# Patient Record
Sex: Male | Born: 1965
Health system: Southern US, Community
[De-identification: ages and names within clinical notes are randomized; demographics above are authoritative.]

## PROBLEM LIST (undated history)

## (undated) DIAGNOSIS — T7840XA Allergy, unspecified, initial encounter: Secondary | ICD-10-CM

## (undated) DIAGNOSIS — I10 Essential (primary) hypertension: Secondary | ICD-10-CM

## (undated) DIAGNOSIS — Z87442 Personal history of urinary calculi: Secondary | ICD-10-CM

## (undated) DIAGNOSIS — R011 Cardiac murmur, unspecified: Secondary | ICD-10-CM

## (undated) HISTORY — DX: Cardiac murmur, unspecified: R01.1

## (undated) HISTORY — PX: LITHOTRIPSY: SUR834

## (undated) HISTORY — DX: Allergy, unspecified, initial encounter: T78.40XA

## (undated) HISTORY — DX: Essential (primary) hypertension: I10

## (undated) HISTORY — PX: OTHER SURGICAL HISTORY: SHX169

---

## 2015-01-17 ENCOUNTER — Emergency Department (HOSPITAL_COMMUNITY)
Admission: EM | Admit: 2015-01-17 | Discharge: 2015-01-17 | Disposition: A | Discharge status: Home / Self Care | Payer: Worker's Compensation | Attending: Emergency Medicine | Admitting: Emergency Medicine

## 2015-01-17 ENCOUNTER — Encounter (HOSPITAL_COMMUNITY): Payer: Self-pay | Admitting: Physical Medicine and Rehabilitation

## 2015-01-17 ENCOUNTER — Emergency Department (HOSPITAL_COMMUNITY): Payer: Worker's Compensation

## 2015-01-17 DIAGNOSIS — Y929 Unspecified place or not applicable: Secondary | ICD-10-CM | POA: Insufficient documentation

## 2015-01-17 DIAGNOSIS — Z23 Encounter for immunization: Secondary | ICD-10-CM | POA: Insufficient documentation

## 2015-01-17 DIAGNOSIS — S67191A Crushing injury of left index finger, initial encounter: Secondary | ICD-10-CM | POA: Insufficient documentation

## 2015-01-17 DIAGNOSIS — S6722XA Crushing injury of left hand, initial encounter: Secondary | ICD-10-CM

## 2015-01-17 DIAGNOSIS — Y9389 Activity, other specified: Secondary | ICD-10-CM | POA: Insufficient documentation

## 2015-01-17 DIAGNOSIS — W231XXA Caught, crushed, jammed, or pinched between stationary objects, initial encounter: Secondary | ICD-10-CM | POA: Diagnosis not present

## 2015-01-17 DIAGNOSIS — Y990 Civilian activity done for income or pay: Secondary | ICD-10-CM | POA: Diagnosis not present

## 2015-01-17 DIAGNOSIS — S6992XA Unspecified injury of left wrist, hand and finger(s), initial encounter: Secondary | ICD-10-CM | POA: Diagnosis present

## 2015-01-17 MED ORDER — CEFAZOLIN SODIUM 1 G IJ SOLR
2.0000 g | Freq: Once | INTRAMUSCULAR | Status: DC
Start: 2015-01-17 — End: 2015-01-17

## 2015-01-17 MED ORDER — TETANUS-DIPHTH-ACELL PERTUSSIS 5-2.5-18.5 LF-MCG/0.5 IM SUSP
0.5000 mL | Freq: Once | INTRAMUSCULAR | Status: AC
  Administered 2015-01-17: 0.5 mL via INTRAMUSCULAR
  Filled 2015-01-17: qty 0.5

## 2015-01-17 MED ORDER — BUPIVACAINE HCL 0.25 % IJ SOLN
30.0000 mL | Freq: Once | INTRAMUSCULAR | Status: AC
  Administered 2015-01-17: 30 mL
  Filled 2015-01-17: qty 30

## 2015-01-17 MED ORDER — LIDOCAINE HCL (PF) 1 % IJ SOLN
30.0000 mL | Freq: Once | INTRAMUSCULAR | Status: DC
  Filled 2015-01-17: qty 30

## 2015-01-17 MED ORDER — FENTANYL CITRATE (PF) 100 MCG/2ML IJ SOLN
50.0000 ug | Freq: Once | INTRAMUSCULAR | Status: AC
  Administered 2015-01-17: 50 ug via INTRAVENOUS
  Filled 2015-01-17: qty 2

## 2015-01-17 MED ORDER — MORPHINE SULFATE 4 MG/ML IJ SOLN
4.0000 mg | Freq: Once | INTRAMUSCULAR | Status: AC
  Administered 2015-01-17: 4 mg via INTRAVENOUS
  Filled 2015-01-17: qty 1

## 2015-01-17 MED ORDER — CEFAZOLIN SODIUM-DEXTROSE 2-3 GM-% IV SOLR
2.0000 g | Freq: Once | INTRAVENOUS | Status: AC
  Administered 2015-01-17: 2 g via INTRAVENOUS
  Filled 2015-01-17: qty 50

## 2015-01-17 MED ORDER — CEPHALEXIN 500 MG PO CAPS: 500.0000 mg | ORAL_CAPSULE | Freq: Four times a day (QID) | ORAL | Status: DC

## 2015-01-17 MED ORDER — OXYCODONE HCL 5 MG PO TABS: 5.0000 mg | ORAL_TABLET | ORAL | Status: DC | PRN

## 2015-01-17 NOTE — Discharge Instructions (Signed)
..  We recommend that you to take vitamin C 1000 mg a day to promote healing. We also recommend that if you require  pain medicine that you take a stool softener to prevent constipation as most pain medicines will have constipation side effects. We recommend either Peri-Colace or Senokot and recommend that you also consider adding MiraLAX to prevent the constipation affects from pain medicine if you are required to use them. These medicines are over the counter and maybe purchased at a local pharmacy. A cup of yogurt and a probiotic can also be helpful during the recovery process as the medicines can disrupt your intestinal environment.  Marland KitchenKeep bandage clean and dry.  Call for any problems.  No smoking.  Criteria for driving a car: you should be off your pain medicine for 7-8 hours, able to drive one handed(confident), thinking clearly and feeling able in your judgement to drive. Continue elevation as it will decrease swelling.  If instructed by MD move your fingers within the confines of the bandage/splint.  Use ice if instructed by your MD. Call immediately for any sudden loss of feeling in your hand/arm or change in functional abilities of the extremity. Please take an aspirin daily, 325 mg.

## 2015-01-17 NOTE — ED Provider Notes (Signed)
CSN: [id number]     Arrival date & time 01/17/15  1023 History   First MD Initiated Contact with Patient 01/17/15 1209     Chief Complaint  Patient presents with  . Finger Injury   Jonathan Mcclain is a 49 y.o. right hand dominant male who presents to the emergency department complaining of an injury to his left second finger. The patient reports he was working with concrete and a metal bar crushed his finger between concrete. He is complaining of 5 out of 10 left index finger pain. Patient is unsure of his last tetanus shot and has been updated in the ED. He denies other injury.   (Consider location/radiation/quality/duration/timing/severity/associated sxs/prior Treatment) HPI  History reviewed. No pertinent past medical history. History reviewed. No pertinent past surgical history. No family history on file. History  Substance Use Topics  . Smoking status: Never Smoker   . Smokeless tobacco: Not on file  . Alcohol Use: No    Review of Systems  Constitutional: Negative for fever.  Respiratory: Negative for cough.   Gastrointestinal: Negative for nausea, vomiting and abdominal pain.  Musculoskeletal:       Left index finger pain and laceration.   Skin: Positive for wound.  Neurological: Negative for dizziness, light-headedness and headaches.      Allergies  Review of patient's allergies indicates no known allergies.  Home Medications   Prior to Admission medications   Medication Sig Start Date End Date Taking? Authorizing Provider  naproxen sodium (ANAPROX) 220 MG tablet Take 220 mg by mouth daily as needed (headaches).   Yes Historical Provider, MD  cephALEXin (KEFLEX) 500 MG capsule Take 1 capsule (500 mg total) by mouth 4 (four) times daily. 01/17/15   Avelina Laine, PA-C  oxyCODONE (ROXICODONE) 5 MG immediate release tablet Take 1 tablet (5 mg total) by mouth every 4 (four) hours as needed for severe pain. 01/17/15   Avelina Laine, PA-C   BP 141/79 mmHg  Pulse 83   Temp(Src) 97.8 F (36.6 C)  Resp 16  Ht 6' (1.829 m)  Wt 190 lb (86.183 kg)  BMI 25.76 kg/m2  SpO2 96% Physical Exam  Constitutional: He appears well-developed and well-nourished. No distress.  HENT:  Head: Normocephalic and atraumatic.  Eyes: Right eye exhibits no discharge. Left eye exhibits no discharge.  Cardiovascular: Normal rate, regular rhythm, normal heart sounds and intact distal pulses.   Bilateral radial pulses are intact.  Pulmonary/Chest: Effort normal. No respiratory distress.  Musculoskeletal:  Good range of motion of his left fingers and wrist.   Neurological: He is alert. Coordination normal.   No numbness or weakness surrounding or proximal to the wound.   Skin: Skin is warm and dry. No rash noted. He is not diaphoretic.  Avulsion and laceration of his distal left index fingertip nailbed and underlying tissue.   Psychiatric: He has a normal mood and affect. His behavior is normal.  Nursing note and vitals reviewed.   ED Course  Procedures (including critical care time) Labs Review Labs Reviewed - No data to display  Imaging Review Dg Finger Index Left  01/17/2015   CLINICAL DATA:  Injured LEFT index finger distally at work working on concrete, laceration, initial encounter  EXAM: LEFT INDEX FINGER 2+V  COMPARISON:  None  FINDINGS: Dressing artifacts distal phalanx.  Displaced tuft fracture distal phalanx LEFT index finger.  Joint spaces preserved.  Osseous mineralization normal.  No additional fracture, dislocation or bone destruction.  Soft tissue deformities due to laceration.  IMPRESSION: Displaced tuft fracture distal phalanx LEFT index finger.   Electronically Signed   By: Lavonia Dana M.D.   On: 01/17/2015 12:36     EKG Interpretation None      Filed Vitals:   01/17/15 1330 01/17/15 1400 01/17/15 1430 01/17/15 1530  BP: 142/83 135/88 127/77 141/79  Pulse: 71 71 64 83  Temp:      Resp: 15  16   Height:      Weight:      SpO2: 97% 96% 95% 96%      MDM   Meds given in ED:  Medications  lidocaine (PF) (XYLOCAINE) 1 % injection 30 mL (30 mLs Infiltration Not Given 01/17/15 1401)  Tdap (BOOSTRIX) injection 0.5 mL (0.5 mLs Intramuscular Given 01/17/15 1155)  fentaNYL (SUBLIMAZE) injection 50 mcg (50 mcg Intravenous Given 01/17/15 1155)  morphine 4 MG/ML injection 4 mg (4 mg Intravenous Given 01/17/15 1259)  bupivacaine (MARCAINE) 0.25 % (with pres) injection 30 mL (30 mLs Infiltration Given 01/17/15 1525)  ceFAZolin (ANCEF) IVPB 2 g/50 mL premix (0 g Intravenous Stopped 01/17/15 1442)    Discharge Medication List as of 01/17/2015  3:37 PM    START taking these medications   Details  cephALEXin (KEFLEX) 500 MG capsule Take 1 capsule (500 mg total) by mouth 4 (four) times daily., Starting 01/17/2015, Until Discontinued, Print    oxyCODONE (ROXICODONE) 5 MG immediate release tablet Take 1 tablet (5 mg total) by mouth every 4 (four) hours as needed for severe pain., Starting 01/17/2015, Until Discontinued, Print        Final diagnoses:  Crushing injury of finger of left hand, initial encounter    This is a 49 y.o. right hand dominant male who presents to the emergency department complaining of an injury to his left second finger. The patient reports he was working with concrete and a metal bar crushed his finger between concrete. On exam patient is afebrile and nontoxic appearing. He has significant derangement of his left index finger. His Tdap was updated in the ED.  X-ray indicates a displaced tuft fracture is distal phalanx. Hand surgery was consulted.  Hand Surgical PA Avelina Laine came down and did a significant repair of his finger in the ED. Patient was given Ancef 2 g in the ED and discharged on Keflex 500 mg 4 times a day. The patient was discharged by Avelina Laine, PA-C with paperwork and follow up in good condition. Please see his note for further.     Waynetta Pean, PA-C 01/17/15 Craig, MD 01/18/15  [phone]

## 2015-01-17 NOTE — ED Notes (Signed)
Pt presents to department for evaluation of L 2nd finger laceration. 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 Bleeding controlled upon arrival to ED. Movement and sensation intact.

## 2015-01-17 NOTE — ED Notes (Signed)
MD at bedside. Hand surgeon

## 2015-01-17 NOTE — Consult Note (Signed)
Reason for Consult: Crush injury to left index finger Referring Physician: Dr. Sheppard [facility]  Jonathan Mcclain is an 49 y.o. male.  HPI: Patient is a pleasant 49 year old gentleman, right-hand dominant, who sustained an on-the-job injury earlier today. He is employed at [employer] and he and a coworker were removing a heavy headstone today. Unfortunately, the patient's left index finger became wedged between a pry bar and a heavy piece of concrete, resulting in a significant crushing injury to the left index finger distal tip. She had immediate pain and deformity and significant bleeding and proceeded to the emergency room for evaluation. Upon evaluation by the emergency room staff he was noted to have findings consistent with significant distal tip disarray, open fracture process about the distal phalanx, and near nail evulsion. Hand surgery consult was implemented. Radiographs are reviewed and show findings consistent with an open fracture about the distal phalanx, comminuted in nature. Patient denies any other acute injuries. He states he did sustain a crush injury to the left middle finger approximately 2 months ago, but did not lose the nail.  History reviewed. No pertinent past medical history.  History reviewed. No pertinent past surgical history.  No family history on file.  Social History:  reports that he has never smoked. He does not have any smokeless tobacco history on file. He reports that he does not drink alcohol or use illicit drugs.  Allergies: No Known Allergies  Medications: I have reviewed the patient's current medications.  No results found for this or any previous visit (from the past 48 hour(s)).  Dg Finger Index Left  01/17/2015   CLINICAL DATA:  Injured LEFT index finger distally at work working on concrete, laceration, initial encounter  EXAM: LEFT INDEX FINGER 2+V  COMPARISON:  None  FINDINGS: Dressing artifacts distal phalanx.   Displaced tuft fracture distal phalanx LEFT index finger.  Joint spaces preserved.  Osseous mineralization normal.  No additional fracture, dislocation or bone destruction.  Soft tissue deformities due to laceration.  IMPRESSION: Displaced tuft fracture distal phalanx LEFT index finger.   Electronically Signed   By: Lavonia Dana M.D.   On: 01/17/2015 12:36    Review of Systems  Constitutional: Negative.   HENT: Negative.   Respiratory: Negative.   Cardiovascular: Negative.   Gastrointestinal: Negative.   Musculoskeletal:       See history of the present illness  Skin: Negative.    Blood pressure 127/77, pulse 64, temperature 97.8 F (36.6 C), resp. rate 16, height 6' (1.829 m), weight 86.183 kg (190 lb), SpO2 95 %. Physical Exam  Patient is pleasant, in no acute distress, alert and oriented, family and friends are in the room Head: Atraumatic normocephalic Chest: Expansions are present Abdomen: Nontender Left upper extremity: Evaluation of the left index finger shows obvious significant soft tissue disarray and deformity, with near amputation and exposed bone present as well as partial nail avulsion. Gross refill is intact.  Assessment/Plan: 1.  left index finger crush injury 2.  open distal phalanx fracture comminuted and displaced about the left index finger distal tip 3.  nailbed laceration  4. Partial nail plate avulsion 5. Near amputation of the left index finger distal phalanx I have had a lengthy discussion with the patient and his family in regards to his upper extremity predicament and our concerns. I have recommended that we proceed ahead urgently with irrigation and excisional debridement as well as repair. I have discussed with him our concerns for infection  given this is an open fracture and secondly, vascular flow, about the radial aspect of the distal phalanx given the significant crush injury and near amputation. Verbal consent was obtained to proceed with reconstruction,  all questions were encouraged and answered. Procedure in detail:  1. Irrigation and excisional debridement of the skin subcutaneous tissue and bony fragments about the left index finger distal tip 2. Treatment of an open fracture about the left index finger distal phalanx 3. Nail plate removal left index finger distal phalanx 4. Nailbed repair left index finger distal phalanx 5. Complex closure left index finger distal tip  After obtaining verbal consent, the patient underwent a local digital block to the left index finger utilizing 5 mL of Xylocaine and 5 mL of Sensorcaine without epinephrine. The patient tolerated this well and there was no complications. Once appropriate anesthetic was obtained attention was turned to the distal tip of the finger. It was noted that he had significant jagged soft tissue disarray about the radial aspect of the finger and exposed bony fragments present. Copious irrigation of greater than 2 L of normal saline was implemented into the wounds. Following this excisional debridement of necrotic and pre-necrotic tissue about the distal tip of the finger was performed to include skin and subcutaneous tissue and bony fragments. Following this, the remaining portion of the nail plate was removed utilizing a Soil scientist without difficulties. Patient had an oblique laceration about the nailbed and the very distal margin all the way to the eponychial fold ulnarly. Laceration continued about the volar radial aspect of the finger. The radial flap about the distal phalanx was gently excised to healthy bleeding tissue. Following this, loose closure of the skin about the radial aspect of the finger was performed utilizing a series of simple chromic sutures, 4 variety. I did make sure not to overly tighten the sutures to insure further blood flow would be compromised. Attention was then turned to the nail bed where the edges were sharply debridement and repair utilizing 5-0 chromic was  performed. Simple sutures were placed about the nailbed. Following this the finger turning it was released and refill was noted. I discussed with the patient our concerns for the possibility and need for further debridements pending the radial flap viability. It was then placed under the eponychial fold followed by nonadherent dressings, followed by soft dressings and a finger splint. We have discussed with the patient the need to elevate the upper extremity, keep this clean and dry. He will take his antibiotics in the form of Keflex 500 mg one 4 times daily and oxycodone 5 mg 1-2 4to6 hours as needed for pain. Discussed with him per his request return to light work duties for Thursday, however, absolutely no use of the left upper extremity and he should keep this clean and dry at all times. Patient was given 2 g of IV Ancef while in the emergency room setting. I have also recommended he take an aspirin 325 mg daily. Have discussed a stool softener while on pain medications and the merits of vitamin C. He will need to follow up in our office in 5-7 days with a therapy appointment immediately following for a distal tip protector. All questions were encouraged and answered.  Sandrea Boer L 01/17/2015, 3:11 PM

## 2015-10-11 ENCOUNTER — Other Ambulatory Visit: Payer: Self-pay | Admitting: Orthopaedic Surgery

## 2015-10-11 DIAGNOSIS — M25511 Pain in right shoulder: Secondary | ICD-10-CM

## 2015-10-18 ENCOUNTER — Ambulatory Visit
Admission: RE | Admit: 2015-10-18 | Discharge: 2015-10-18 | Disposition: A | Discharge status: Home / Self Care | Payer: 59 | Source: Ambulatory Visit | Attending: Orthopaedic Surgery | Admitting: Orthopaedic Surgery

## 2015-10-18 DIAGNOSIS — M25511 Pain in right shoulder: Secondary | ICD-10-CM

## 2018-04-20 ENCOUNTER — Other Ambulatory Visit: Payer: Self-pay | Admitting: Urology

## 2018-04-23 ENCOUNTER — Encounter (HOSPITAL_COMMUNITY): Payer: Self-pay | Admitting: *Deleted

## 2018-04-23 ENCOUNTER — Ambulatory Visit (HOSPITAL_COMMUNITY): Payer: 59

## 2018-04-23 ENCOUNTER — Ambulatory Visit (HOSPITAL_COMMUNITY)
Admission: RE | Admit: 2018-04-23 | Discharge: 2018-04-23 | Disposition: A | Discharge status: Home / Self Care | Payer: 59 | Source: Ambulatory Visit | Attending: Urology | Admitting: Urology

## 2018-04-23 ENCOUNTER — Encounter (HOSPITAL_COMMUNITY)
Admission: RE | Disposition: A | Discharge status: Home / Self Care | Payer: Self-pay | Source: Ambulatory Visit | Attending: Urology

## 2018-04-23 ENCOUNTER — Other Ambulatory Visit: Payer: Self-pay

## 2018-04-23 DIAGNOSIS — N200 Calculus of kidney: Secondary | ICD-10-CM | POA: Diagnosis not present

## 2018-04-23 DIAGNOSIS — N201 Calculus of ureter: Secondary | ICD-10-CM | POA: Diagnosis present

## 2018-04-23 HISTORY — DX: Personal history of urinary calculi: Z87.442

## 2018-04-23 HISTORY — PX: EXTRACORPOREAL SHOCK WAVE LITHOTRIPSY: SHX1557

## 2018-04-23 SURGERY — LITHOTRIPSY, ESWL
Anesthesia: LOCAL | Laterality: Left

## 2018-04-23 MED ORDER — DIAZEPAM 5 MG PO TABS
10.0000 mg | ORAL_TABLET | ORAL | Status: AC
  Administered 2018-04-23: 10 mg via ORAL
  Filled 2018-04-23: qty 2

## 2018-04-23 MED ORDER — TAMSULOSIN HCL 0.4 MG PO CAPS: 0.4000 mg | ORAL_CAPSULE | Freq: Every day | ORAL | 0 refills | Status: AC

## 2018-04-23 MED ORDER — SODIUM CHLORIDE 0.9 % IV SOLN
INTRAVENOUS | Status: DC
  Administered 2018-04-23: 10:00:00 via INTRAVENOUS

## 2018-04-23 MED ORDER — DIPHENHYDRAMINE HCL 25 MG PO CAPS
25.0000 mg | ORAL_CAPSULE | ORAL | Status: AC
  Administered 2018-04-23: 25 mg via ORAL
  Filled 2018-04-23: qty 1

## 2018-04-23 MED ORDER — OXYCODONE-ACETAMINOPHEN 5-325 MG PO TABS: 1.0000 | ORAL_TABLET | Freq: Four times a day (QID) | ORAL | 0 refills | Status: AC | PRN

## 2018-04-23 MED ORDER — CIPROFLOXACIN HCL 500 MG PO TABS
500.0000 mg | ORAL_TABLET | ORAL | Status: AC
  Administered 2018-04-23: 500 mg via ORAL
  Filled 2018-04-23: qty 1

## 2018-04-23 MED ORDER — SENNOSIDES-DOCUSATE SODIUM 8.6-50 MG PO TABS: 1.0000 | ORAL_TABLET | Freq: Two times a day (BID) | ORAL | 0 refills | Status: DC

## 2018-04-23 NOTE — Brief Op Note (Signed)
04/23/2018  1:15 PM  PATIENT:  Jonathan Mcclain  52 y.o. male  PRE-OPERATIVE DIAGNOSIS:  LEFT URETEROPELVIC JUNCTION STONE  POST-OPERATIVE DIAGNOSIS:  * No post-op diagnosis entered *  PROCEDURE:  Procedure(s): LEFT EXTRACORPOREAL SHOCK WAVE LITHOTRIPSY (ESWL) (Left)  SURGEON:  Surgeon(s) and Role:    Alexis Frock, MD - Primary  PHYSICIAN ASSISTANT:   ASSISTANTS: none   ANESTHESIA:   IV sedation  EBL:  minimal   BLOOD ADMINISTERED:none  DRAINS: none   LOCAL MEDICATIONS USED:  NONE  SPECIMEN:  No Specimen  DISPOSITION OF SPECIMEN:  N/A  COUNTS:  YES  TOURNIQUET:  * No tourniquets in log *  DICTATION: .Note written in paper chart  This is likely staged procedure given size and complexity of stone orientation.   PLAN OF CARE: Discharge to home after PACU  PATIENT DISPOSITION:  PACU - hemodynamically stable.   Delay start of Pharmacological VTE agent (>24hrs) due to surgical blood loss or risk of bleeding: yes

## 2018-04-23 NOTE — H&P (Signed)
Jonathan Mcclain is an 52 y.o. male.    Chief Complaint: Pre-op LEFT Shockwave Lithotripsy  HPI:   1 - LEFT Proximal Ureteral Stone - 11 mm left UPJ stone by KUB 04/2018 on eval left flank pain, has had prior stones. Stone appears solitary by KUB.   Today "Jonathan Mcclain" is seen to proceed with LEFT SWL. NO interval fevers.   Social History:  reports that he has never smoked. He does not have any smokeless tobacco history on file. He reports that he does not drink alcohol or use drugs.  Allergies: No Known Allergies  No medications prior to admission.    No results found for this or any previous visit (from the past 48 hour(s)). No results found.  Review of Systems  Constitutional: Negative.  Negative for chills and fever.  HENT: Negative.   Eyes: Negative.   Respiratory: Negative.   Cardiovascular: Negative.   Gastrointestinal: Negative.   Genitourinary: Positive for flank pain.  Skin: Negative.   Neurological: Negative.   Endo/Heme/Allergies: Negative.     There were no vitals taken for this visit. Physical Exam  Constitutional: He appears well-developed.  HENT:  Head: Normocephalic.  Eyes: Pupils are equal, round, and reactive to light.  Neck: Normal range of motion.  Cardiovascular: Normal rate.  Respiratory: Effort normal.  GI: Soft.  Genitourinary:  Genitourinary Comments: Mild LEFT CVAT at present.   Musculoskeletal: Normal range of motion.  Skin: Skin is warm.  Psychiatric: He has a normal mood and affect.     Assessment/Plan  Proceed as planned with LEFT shockwave lithotripsy. Risks, benefits, alternatives, expected peri-op course discussed previously and reiterated today.   Alexis Frock, MD 04/23/2018, 7:22 AM

## 2018-04-23 NOTE — Discharge Instructions (Signed)
1 - You may have urinary urgency (bladder spasms), pass small stone fragments, and bloody urine on / off x 2 weeks. This is normal.  2 - Call MD or go to ER for fever >102, severe pain / nausea / vomiting not relieved by medications, or acute change in medical status

## 2018-06-29 ENCOUNTER — Encounter (HOSPITAL_COMMUNITY): Payer: Self-pay | Admitting: Urology

## 2018-08-18 ENCOUNTER — Ambulatory Visit (INDEPENDENT_AMBULATORY_CARE_PROVIDER_SITE_OTHER): Payer: 59 | Admitting: Orthopaedic Surgery

## 2018-08-18 ENCOUNTER — Encounter (INDEPENDENT_AMBULATORY_CARE_PROVIDER_SITE_OTHER): Payer: Self-pay

## 2018-08-18 ENCOUNTER — Ambulatory Visit (INDEPENDENT_AMBULATORY_CARE_PROVIDER_SITE_OTHER): Payer: Self-pay

## 2018-08-18 ENCOUNTER — Encounter (INDEPENDENT_AMBULATORY_CARE_PROVIDER_SITE_OTHER): Payer: Self-pay | Admitting: Orthopaedic Surgery

## 2018-08-18 VITALS — BP 168/107 | HR 76 | Ht 72.0 in | Wt 180.0 lb

## 2018-08-18 DIAGNOSIS — M79672 Pain in left foot: Secondary | ICD-10-CM

## 2018-08-18 NOTE — Progress Notes (Signed)
Office Visit Note   Patient: Jonathan Mcclain           Date of Birth: Mar 10, 1966           MRN: [id number] Visit Date: 08/18/2018              Requested by: No referring provider defined for this encounter. PCP: Patient, No Pcp Per   Assessment & Plan: Visit Diagnoses:  1. Left foot pain     Plan: Plantar fasciitis left foot with pronation.  Will apply full arch supports consider cortisone injection towards the end of the year when he has several days off.  Can use NSAIDs.  Felt better after insertion of the supports  Follow-Up Instructions: Return if symptoms worsen or fail to improve.   Orders:  Orders Placed This Encounter  Procedures  . XR Foot Complete Left   No orders of the defined types were placed in this encounter.     Procedures: No procedures performed   Clinical Data: No additional findings.   Subjective: Chief Complaint  Patient presents with  . Left Foot - Pain    NKI, a lot of pain in the foot, was in the arch now its in the heel, tried OTC plantar fascitis remedies and did get some relief.  Jonathan Mcclain is 52 years old visited the office evaluation of left foot pain.  He works for a [employer] and is constantly using his foot taken to the ground.  He denies any injury or trauma but is having considerable pain on the plantar aspect of his left foot.  When he wears his work boots he is more comfortable if he walks on a hard surface he has considerable pain.  Pain is on the plantar aspect of his foot between the arch and the os calcis.  No skin changes.  No numbness or tingling.  Denies any ankle pain  HPI  Review of Systems   Objective: Vital Signs: BP (!) 168/107 (BP Location: Left Arm, Patient Position: Sitting)   Pulse 76   Ht 6' (1.829 m)   Wt 180 lb (81.6 kg)   BMI 24.41 kg/m   Physical Exam  Constitutional: He is oriented to person, place, and time. He appears well-developed and well-nourished.  HENT:  Mouth/Throat: Oropharynx is  clear and moist.  Eyes: Pupils are equal, round, and reactive to light. EOM are normal.  Pulmonary/Chest: Effort normal.  Neurological: He is alert and oriented to person, place, and time.  Skin: Skin is warm and dry.  Psychiatric: He has a normal mood and affect. His behavior is normal.    Ortho Exam awake alert and oriented x3 comfortable sitting.  Examination left foot reveals pronation with weightbearing.  He does have a localized area of tenderness just distal to the os calcis in the plantar aspect of his foot.  No skin change.  No posterior heel pain.  No Achilles discomfort.  No pain in the dorsum of the foot.  Neurovascular exam intact  Specialty Comments:  No specialty comments available.  Imaging: Xr Foot Complete Left  Result Date: 08/18/2018 Films of the left foot were obtained in 3 projections standing.  There is a large plantar heel spur which could be consistent with the patient's symptoms of plantar fasciitis.  No arthritic changes in the hindfoot or midfoot.  No acute changes    PMFS History: There are no active problems to display for this patient.  Past Medical History:  Diagnosis Date  .  History of kidney stones     History reviewed. No pertinent family history.  Past Surgical History:  Procedure Laterality Date  . EXTRACORPOREAL SHOCK WAVE LITHOTRIPSY Left 04/23/2018   Procedure: LEFT EXTRACORPOREAL SHOCK WAVE LITHOTRIPSY (ESWL);  Surgeon: Jonathan Frock, MD;  Location: [facility];  Service: Urology;  Laterality: Left;  . LITHOTRIPSY    . tubes in ears bilat     Social History   Occupational History  . Not on file  Tobacco Use  . Smoking status: Never Smoker  . Smokeless tobacco: Never Used  Substance and Sexual Activity  . Alcohol use: No  . Drug use: No  . Sexual activity: Not on file     Jonathan Balding, MD   Note - This record has been created using [employer].  Chart creation errors have been sought, but may not always  have been  located. Such creation errors do not reflect on  the standard of medical care.

## 2018-09-07 ENCOUNTER — Encounter (INDEPENDENT_AMBULATORY_CARE_PROVIDER_SITE_OTHER): Payer: Self-pay | Admitting: Orthopaedic Surgery

## 2018-09-07 ENCOUNTER — Ambulatory Visit (INDEPENDENT_AMBULATORY_CARE_PROVIDER_SITE_OTHER): Payer: 59 | Admitting: Orthopaedic Surgery

## 2018-09-07 VITALS — BP 159/105 | HR 93 | Ht 72.0 in | Wt 185.0 lb

## 2018-09-07 DIAGNOSIS — M79672 Pain in left foot: Secondary | ICD-10-CM | POA: Diagnosis not present

## 2018-09-07 MED ORDER — METHYLPREDNISOLONE ACETATE 40 MG/ML IJ SUSP
40.0000 mg | INTRAMUSCULAR | Status: AC | PRN
  Administered 2018-09-07: 40 mg

## 2018-09-07 MED ORDER — LIDOCAINE HCL 1 % IJ SOLN
1.0000 mL | INTRAMUSCULAR | Status: AC | PRN
  Administered 2018-09-07: 1 mL

## 2018-09-07 MED ORDER — BUPIVACAINE HCL 0.5 % IJ SOLN
1.0000 mL | INTRAMUSCULAR | Status: AC | PRN
  Administered 2018-09-07: 1 mL

## 2018-09-07 NOTE — Progress Notes (Signed)
   Office Visit Note   Patient: Jonathan Mcclain           Date of Birth: Jun 18, 1966           MRN: [id number] Visit Date: 09/07/2018              Requested by: No referring provider defined for this encounter. PCP: Patient, No Pcp Per   Assessment & Plan: Visit Diagnoses:  1. Left foot pain     Plan: Feeling better with the arch supports but still having some pain in a localized area the left plantar fascial attachment.  Will inject with cortisone and monitor response.  Provide with a second arch support  Follow-Up Instructions: Return if symptoms worsen or fail to improve.   Orders:  No orders of the defined types were placed in this encounter.  No orders of the defined types were placed in this encounter.     Procedures: Foot Inj Date/Time: 09/07/2018 4:23 PM Performed by: Garald Balding, MD Authorized by: Garald Balding, MD   Consent Given by:  Patient Indications:  Fasciitis Condition: Plantar Fasciitis   Location: left plantar fascia muscle   Needle Size:  27 G Medications:  1 mL lidocaine 1 %; 1 mL bupivacaine 0.5 %; 40 mg methylPREDNISolone acetate 40 MG/ML     Clinical Data: No additional findings.   Subjective: No chief complaint on file. Still having some discomfort in the area of the plantar fascial attachment to the left os calcis.  Better since using the arch supports  HPI  Review of Systems   Objective: Vital Signs: BP (!) 159/105   Pulse 93   Ht 6' (1.829 m)   Wt 185 lb (83.9 kg)   BMI 25.09 kg/m   Physical Exam  Ortho Exam left foot with some tenderness just distal to the plantar fascial insertion on the medial aspect of the os calcis.  No skin changes.  No masses.  Neurovascular exam intact  Specialty Comments:  No specialty comments available.  Imaging: No results found.   PMFS History: There are no active problems to display for this patient.  Past Medical History:  Diagnosis Date  . History of kidney stones       History reviewed. No pertinent family history.  Past Surgical History:  Procedure Laterality Date  . EXTRACORPOREAL SHOCK WAVE LITHOTRIPSY Left 04/23/2018   Procedure: LEFT EXTRACORPOREAL SHOCK WAVE LITHOTRIPSY (ESWL);  Surgeon: Alexis Frock, MD;  Location: [facility];  Service: Urology;  Laterality: Left;  . LITHOTRIPSY    . tubes in ears bilat     Social History   Occupational History  . Not on file  Tobacco Use  . Smoking status: Never Smoker  . Smokeless tobacco: Never Used  Substance and Sexual Activity  . Alcohol use: No  . Drug use: No  . Sexual activity: Not on file     Garald Balding, MD   Note - This record has been created using [employer].  Chart creation errors have been sought, but may not always  have been located. Such creation errors do not reflect on  the standard of medical care.

## 2018-09-07 NOTE — Progress Notes (Signed)
Lt foot bottom heel still is painful. Denied taking tylenol and wearing the inserts .

## 2019-01-13 ENCOUNTER — Other Ambulatory Visit: Payer: Self-pay

## 2019-01-13 ENCOUNTER — Encounter: Payer: Self-pay | Admitting: Orthopaedic Surgery

## 2019-01-13 ENCOUNTER — Ambulatory Visit (INDEPENDENT_AMBULATORY_CARE_PROVIDER_SITE_OTHER): Payer: BLUE CROSS/BLUE SHIELD | Admitting: Orthopaedic Surgery

## 2019-01-13 VITALS — Ht 72.0 in | Wt 185.0 lb

## 2019-01-13 DIAGNOSIS — M722 Plantar fascial fibromatosis: Secondary | ICD-10-CM | POA: Diagnosis not present

## 2019-01-13 MED ORDER — BUPIVACAINE HCL 0.5 % IJ SOLN
1.0000 mL | INTRAMUSCULAR | Status: AC | PRN
  Administered 2019-01-13: 1 mL

## 2019-01-13 MED ORDER — LIDOCAINE HCL 1 % IJ SOLN
1.0000 mL | INTRAMUSCULAR | Status: AC | PRN
Start: 2019-01-13 — End: 2019-01-13
  Administered 2019-01-13: 1 mL

## 2019-01-13 MED ORDER — METHYLPREDNISOLONE ACETATE 40 MG/ML IJ SUSP
40.0000 mg | INTRAMUSCULAR | Status: AC | PRN
Start: 2019-01-13 — End: 2019-01-13
  Administered 2019-01-13: 40 mg

## 2019-01-13 NOTE — Progress Notes (Signed)
Office Visit Note   Patient: Jonathan Mcclain           Date of Birth: 1965/10/03           MRN: [id number] Visit Date: 01/13/2019              Requested by: No referring provider defined for this encounter. PCP: Patient, No Pcp Per   Assessment & Plan: Visit Diagnoses:  1. Plantar fasciitis of left foot     Plan: Recurrent symptoms of plantar fasciitis left foot.  Long discussion regarding diagnosis and consideration of MRI scan at some point.  Will reinject the area of tenderness just distal to the plantar fascial insertion continue with comfortable shoes and inserts.  Will provide exercises for plantar fasciitis  Follow-Up Instructions: Return if symptoms worsen or fail to improve.   Orders:  Orders Placed This Encounter  Procedures  . Foot Inj   No orders of the defined types were placed in this encounter.     Procedures: Foot Inj Date/Time: 01/13/2019 9:35 AM Performed by: Garald Balding, MD Authorized by: Garald Balding, MD   Indications:  Pain and fasciitis Condition: Plantar Fasciitis   Location: left plantar fascia muscle   Needle Size:  27 G Medications:  1 mL lidocaine 1 %; 1 mL bupivacaine 0.5 %; 40 mg methylPREDNISolone acetate 40 MG/ML     Clinical Data: No additional findings.   Subjective: Chief Complaint  Patient presents with  . Left Foot - Follow-up  Patient presents today for follow up on his left foot. He was here four months ago for plantar fasciitis. He was injected with cortisone in December. He states that it helped for awhile, but symptoms have returned. He does some stretching exercises.  No related back pain hip or thigh pain.  No radiculopathy.  Pain seems to be worse when he first gets up from a sitting position or when he spends long periods of time on his feet.  Work boots and the arch supports really make a difference. HPI  Review of Systems   Objective: Vital Signs: Ht 6' (1.829 m)   Wt 185 lb (83.9 kg)   BMI 25.09  kg/m   Physical Exam Constitutional:      Appearance: He is well-developed.  Eyes:     Pupils: Pupils are equal, round, and reactive to light.  Pulmonary:     Effort: Pulmonary effort is normal.  Skin:    General: Skin is warm and dry.  Neurological:     Mental Status: He is alert and oriented to person, place, and time.  Psychiatric:        Behavior: Behavior normal.     Ortho Exam awake alert and oriented x3.  Comfortable sitting.  Has pain on the plantar aspect of the left foot just distal to the plantar fascial insertion on the os calcis.  No skin changes.  No masses.  Neurologically intact.  Does have pes planus.  Straight leg raise negative.  Painless range of motion both hips and knees  Specialty Comments:  No specialty comments available.  Imaging: No results found.   PMFS History: Patient Active Problem List   Diagnosis Date Noted  . Plantar fasciitis of left foot 01/13/2019   Past Medical History:  Diagnosis Date  . History of kidney stones     History reviewed. No pertinent family history.  Past Surgical History:  Procedure Laterality Date  . EXTRACORPOREAL SHOCK WAVE LITHOTRIPSY Left 04/23/2018   Procedure:  LEFT EXTRACORPOREAL SHOCK WAVE LITHOTRIPSY (ESWL);  Surgeon: Alexis Frock, MD;  Location: [facility];  Service: Urology;  Laterality: Left;  . LITHOTRIPSY    . tubes in ears bilat     Social History   Occupational History  . Not on file  Tobacco Use  . Smoking status: Never Smoker  . Smokeless tobacco: Never Used  Substance and Sexual Activity  . Alcohol use: No  . Drug use: No  . Sexual activity: Not on file

## 2020-05-31 ENCOUNTER — Other Ambulatory Visit: Payer: Self-pay | Admitting: Oncology

## 2020-05-31 ENCOUNTER — Ambulatory Visit (HOSPITAL_COMMUNITY)
Admission: RE | Admit: 2020-05-31 | Discharge: 2020-05-31 | Disposition: A | Discharge status: Home / Self Care | Payer: BC Managed Care – PPO | Source: Ambulatory Visit | Attending: Pulmonary Disease | Admitting: Pulmonary Disease

## 2020-05-31 DIAGNOSIS — U071 COVID-19: Secondary | ICD-10-CM | POA: Insufficient documentation

## 2020-05-31 MED ORDER — SODIUM CHLORIDE 0.9 % IV SOLN: INTRAVENOUS | Status: DC | PRN

## 2020-05-31 MED ORDER — METHYLPREDNISOLONE SODIUM SUCC 125 MG IJ SOLR: 125.0000 mg | Freq: Once | INTRAMUSCULAR | Status: DC | PRN

## 2020-05-31 MED ORDER — EPINEPHRINE 0.3 MG/0.3ML IJ SOAJ: 0.3000 mg | Freq: Once | INTRAMUSCULAR | Status: DC | PRN

## 2020-05-31 MED ORDER — ALBUTEROL SULFATE HFA 108 (90 BASE) MCG/ACT IN AERS: 2.0000 | INHALATION_SPRAY | Freq: Once | RESPIRATORY_TRACT | Status: DC | PRN

## 2020-05-31 MED ORDER — SODIUM CHLORIDE 0.9 % IV SOLN
1200.0000 mg | Freq: Once | INTRAVENOUS | Status: AC
  Administered 2020-05-31: 1200 mg via INTRAVENOUS

## 2020-05-31 MED ORDER — FAMOTIDINE IN NACL 20-0.9 MG/50ML-% IV SOLN: 20.0000 mg | Freq: Once | INTRAVENOUS | Status: DC | PRN

## 2020-05-31 MED ORDER — DIPHENHYDRAMINE HCL 50 MG/ML IJ SOLN: 50.0000 mg | Freq: Once | INTRAMUSCULAR | Status: DC | PRN

## 2020-05-31 NOTE — Progress Notes (Signed)
I connected by phone with  Mr. Demartin now to discuss the potential use of an new treatment for mild to moderate COVID-19 viral infection in non-hospitalized patients.   This patient is a age/sex that meets the FDA criteria for Emergency Use Authorization of casirivimab\imdevimab.  Has a (+) direct SARS-CoV-2 viral test result 1. Has mild or moderate COVID-19  2. Is ? 53 years of age and weighs ? 40 kg 3. Is NOT hospitalized due to COVID-19 4. Is NOT requiring oxygen therapy or requiring an increase in baseline oxygen flow rate due to COVID-19 5. Is within 10 days of symptom onset 6. Has at least one of the high risk factor(s) for progression to severe COVID-19 and/or hospitalization as defined in EUA. ? Specific high risk criteria : BMI > 25   Symptom onset  05/26/2020   I have spoken and communicated the following to the patient or parent/caregiver:   1. FDA has authorized the emergency use of casirivimab\imdevimab for the treatment of mild to moderate COVID-19 in adults and pediatric patients with positive results of direct SARS-CoV-2 viral testing who are 32 years of age and older weighing at least 40 kg, and who are at high risk for progressing to severe COVID-19 and/or hospitalization.   2. The significant known and potential risks and benefits of casirivimab\imdevimab, and the extent to which such potential risks and benefits are unknown.   3. Information on available alternative treatments and the risks and benefits of those alternatives, including clinical trials.   4. Patients treated with casirivimab\imdevimab should continue to self-isolate and use infection control measures (e.g., wear mask, isolate, social distance, avoid sharing personal items, clean and disinfect "high touch" surfaces, and frequent handwashing) according to CDC guidelines.    5. The patient or parent/caregiver has the option to accept or refuse casirivimab\imdevimab .   After reviewing this information with  the patient, The patient agreed to proceed with receiving casirivimab\imdevimab infusion and will be provided a copy of the Fact sheet prior to receiving the infusion.Rulon Abide, AGNP-C [phone] ([place])

## 2020-05-31 NOTE — Progress Notes (Signed)
  Diagnosis: COVID-19  Physician: Wright, MD  Procedure: [facility] In[facility] Med: casirivimab\imdevimab infusion - Provided patient with casirivimab\imdevimab fact sheet for patients, parents and caregivers prior to infusion.  Complications: No immediate complications noted.  Discharge: Discharged home   Duvid Smalls R William Schake 05/31/2020   

## 2020-05-31 NOTE — Discharge Instructions (Signed)

## 2021-06-29 ENCOUNTER — Ambulatory Visit (INDEPENDENT_AMBULATORY_CARE_PROVIDER_SITE_OTHER): Payer: BC Managed Care – PPO | Admitting: Sports Medicine

## 2021-06-29 ENCOUNTER — Other Ambulatory Visit: Payer: Self-pay

## 2021-06-29 ENCOUNTER — Ambulatory Visit (INDEPENDENT_AMBULATORY_CARE_PROVIDER_SITE_OTHER): Payer: BC Managed Care – PPO

## 2021-06-29 ENCOUNTER — Encounter: Payer: Self-pay | Admitting: Sports Medicine

## 2021-06-29 DIAGNOSIS — M25462 Effusion, left knee: Secondary | ICD-10-CM | POA: Diagnosis not present

## 2021-06-29 DIAGNOSIS — M2242 Chondromalacia patellae, left knee: Secondary | ICD-10-CM

## 2021-06-29 DIAGNOSIS — Z09 Encounter for follow-up examination after completed treatment for conditions other than malignant neoplasm: Secondary | ICD-10-CM | POA: Diagnosis not present

## 2021-06-29 DIAGNOSIS — M25461 Effusion, right knee: Secondary | ICD-10-CM | POA: Diagnosis not present

## 2021-06-29 NOTE — Assessment & Plan Note (Addendum)
5 days of severe pain anterior knee, on exam there is positive patellar crepitus with reproduction of pain, I do suspect patellofemoral chondromalacia and/or a patellar or trochlear osteochondral defect. Injected today due to severe pain, adding x-rays, home rehabilitation exercises, reaction knee brace. Return to see me in 4 to 6 weeks, MRI if not sufficiently better.

## 2021-06-29 NOTE — Progress Notes (Signed)
    Procedures performed today:    Procedure: Real-time Ultrasound Guided injection of the left knee Device: Samsung HS60  Verbal informed consent obtained.  Time-out conducted.  Noted no overlying erythema, induration, or other signs of local infection.  Skin prepped in a sterile fashion.  Local anesthesia: Topical Ethyl chloride.  With sterile technique and under real time ultrasound guidance: Noted trace effusion, 1 cc Kenalog 40, 2 cc lidocaine, 2 cc bupivacaine injected easily Completed without difficulty  Advised to call if fevers/chills, erythema, induration, drainage, or persistent bleeding.  Images permanently stored and available for review in PACS.  Impression: Technically successful ultrasound guided injection.  Independent interpretation of notes and tests performed by another provider:   None.  Brief History, Exam, Impression, and Recommendations:    Chondromalacia of patellofemoral joint, left 5 days of severe pain anterior knee, on exam there is positive patellar crepitus with reproduction of pain, I do suspect patellofemoral chondromalacia and/or a patellar or trochlear osteochondral defect. Injected today due to severe pain, adding x-rays, home rehabilitation exercises, reaction knee brace. Return to see me in 4 to 6 weeks, MRI if not sufficiently better.    ___________________________________________ Gwen Her. Dianah Field, M.D., ABFM., CAQSM. Primary Care and [place] Instructor of [place] [facility]

## 2021-07-03 NOTE — Progress Notes (Signed)
VM box is full and cannot accept messages.  Will attempt again.  Charyl Bigger, CMA

## 2021-07-26 ENCOUNTER — Ambulatory Visit (INDEPENDENT_AMBULATORY_CARE_PROVIDER_SITE_OTHER): Payer: BC Managed Care – PPO | Admitting: Sports Medicine

## 2021-07-26 ENCOUNTER — Ambulatory Visit: Payer: BC Managed Care – PPO | Admitting: Emergency Medicine

## 2021-07-26 ENCOUNTER — Other Ambulatory Visit: Payer: Self-pay

## 2021-07-26 DIAGNOSIS — M2242 Chondromalacia patellae, left knee: Secondary | ICD-10-CM | POA: Diagnosis not present

## 2021-07-26 NOTE — Progress Notes (Signed)
    Procedures performed today:    None.  Independent interpretation of notes and tests performed by another provider:   None.  Brief History, Exam, Impression, and Recommendations:    Chondromalacia of patellofemoral joint, left Jonathan Mcclain is a pleasant 55 year old male, at the last visit he was having positive patellar crepitus, pain, we suspected patellofemoral chondromalacia, due to severe pain we injected his knee, he returns today essentially pain-free, he unfortunately still has some discomfort with kneeling, mostly around the house so he favors the other knee, he admits to not doing the conditioning exercises, at this point he can live with the discomfort, but does agree to get more diligent with the conditioning, return as needed.    ___________________________________________ Gwen Her. Dianah Field, M.D., ABFM., CAQSM. Primary Care and [place] Instructor of [place] [facility]

## 2021-07-26 NOTE — Assessment & Plan Note (Signed)
Jonathan Mcclain is a pleasant 55 year old male, at the last visit he was having positive patellar crepitus, pain, we suspected patellofemoral chondromalacia, due to severe pain we injected his knee, he returns today essentially pain-free, he unfortunately still has some discomfort with kneeling, mostly around the house so he favors the other knee, he admits to not doing the conditioning exercises, at this point he can live with the discomfort, but does agree to get more diligent with the conditioning, return as needed.

## 2021-09-19 DIAGNOSIS — R059 Cough, unspecified: Secondary | ICD-10-CM | POA: Diagnosis not present

## 2021-09-19 DIAGNOSIS — J40 Bronchitis, not specified as acute or chronic: Secondary | ICD-10-CM | POA: Diagnosis not present

## 2021-09-19 DIAGNOSIS — J019 Acute sinusitis, unspecified: Secondary | ICD-10-CM | POA: Diagnosis not present

## 2021-09-27 ENCOUNTER — Encounter: Payer: Self-pay | Admitting: Family Medicine

## 2021-09-27 ENCOUNTER — Ambulatory Visit (INDEPENDENT_AMBULATORY_CARE_PROVIDER_SITE_OTHER): Payer: BC Managed Care – PPO | Admitting: Family Medicine

## 2021-09-27 ENCOUNTER — Other Ambulatory Visit: Payer: Self-pay

## 2021-09-27 DIAGNOSIS — I10 Essential (primary) hypertension: Secondary | ICD-10-CM | POA: Diagnosis not present

## 2021-09-27 DIAGNOSIS — K219 Gastro-esophageal reflux disease without esophagitis: Secondary | ICD-10-CM

## 2021-09-27 MED ORDER — LOSARTAN POTASSIUM 50 MG PO TABS: 50.0000 mg | ORAL_TABLET | Freq: Every day | ORAL | 1 refills | Status: DC

## 2021-09-27 NOTE — Progress Notes (Signed)
Jonathan Mcclain - 56 y.o. male MRN [id number]  Date of birth: [birth year]  Subjective No chief complaint on file.   HPI Jonathan Mcclain is a 56 year old male here today for initial visit to establish care.  He has not had a regular primary care doctor in several years.  He does see urology as needed for kidney stones.  He most recently was seen by Dr. Dianah Field for knee pain.  Reports he has had cough for several months.  He is a non-smoker.  He does report daily reflux symptoms.  He is currently managing this with Tums.  He denies chest pain, shortness of breath, palpitations.  Blood pressure is elevated today.  Reports readings at home are typically around 140/100.  He denies any symptoms related to hypertension including chest pain, headaches or vision changes.  ROS:  A comprehensive ROS was completed and negative except as noted per HPI  No Known Allergies  Past Medical History:  Diagnosis Date   History of kidney stones    Hypertension     Past Surgical History:  Procedure Laterality Date   EXTRACORPOREAL SHOCK WAVE LITHOTRIPSY Left 04/23/2018   Procedure: LEFT EXTRACORPOREAL SHOCK WAVE LITHOTRIPSY (ESWL);  Surgeon: Alexis Frock, MD;  Location: [facility];  Service: Urology;  Laterality: Left;   LITHOTRIPSY     tubes in ears bilat      Social History   Socioeconomic History   Marital status: Married    Spouse name: Not on file   Number of children: 6   Years of education: Not on file   Highest education level: Not on file  Occupational History   Not on file  Tobacco Use   Smoking status: Never    Passive exposure: Never   Smokeless tobacco: Never  Vaping Use   Vaping Use: Never used  Substance and Sexual Activity   Alcohol use: Not Currently    Alcohol/week: 1.0 standard drink    Types: 1 Standard drinks or equivalent per week   Drug use: No   Sexual activity: Yes    Partners: Female  Other Topics Concern   Not on file  Social History Narrative   Not on file    Social Determinants of Health   Financial Resource Strain: Not on file  Food Insecurity: Not on file  Transportation Needs: Not on file  Physical Activity: Not on file  Stress: Not on file  Social Connections: Not on file    No family history on file.  Health Maintenance  Topic Date Due   COVID-19 Vaccine (1) Never done   HIV Screening  Never done   Hepatitis C Screening  Never done   COLONOSCOPY (Pts 45-51yrs Insurance coverage will need to be confirmed)  Never done   Zoster Vaccines- Shingrix (1 of 2) Never done   INFLUENZA VACCINE  Never done   TETANUS/TDAP  01/16/2025   HPV VACCINES  Aged Out     ----------------------------------------------------------------------------------------------------------------------------------------------------------------------------------------------------------------- Physical Exam BP (!) 143/103 (BP Location: Left Arm, Patient Position: Sitting, Cuff Size: Normal)    Pulse 84    Temp 97.8 F (36.6 C)    Ht 6' (1.829 m)    Wt 208 lb (94.3 kg)    SpO2 97%    BMI 28.21 kg/m   Physical Exam Constitutional:      Appearance: Normal appearance.  Eyes:     General: No scleral icterus. Cardiovascular:     Rate and Rhythm: Normal rate and regular rhythm.  Pulmonary:     Effort:  Pulmonary effort is normal.     Breath sounds: Normal breath sounds.  Musculoskeletal:     Cervical back: Neck supple.  Neurological:     General: No focal deficit present.     Mental Status: He is alert.  Psychiatric:        Mood and Affect: Mood normal.        Behavior: Behavior normal.    ------------------------------------------------------------------------------------------------------------------------------------------------------------------------------------------------------------------- Assessment and Plan  Essential hypertension Blood pressure elevated in clinic with history of elevated blood pressure at home.  We discussed dietary changes  which may be helpful for managing his blood pressure.  Adding losartan 50 mg daily.    GERD (gastroesophageal reflux disease) I suspect this is contributing to his chronic cough.  Recommend addition of famotidine twice daily.   Meds ordered this encounter  Medications   losartan (COZAAR) 50 MG tablet    Sig: Take 1 tablet (50 mg total) by mouth daily.    Dispense:  90 tablet    Refill:  1    Return in about 4 weeks (around 10/25/2021) for Please schedule for annual exam.    This visit occurred during the SARS-CoV-2 public health emergency.  Safety protocols were in place, including screening questions prior to the visit, additional usage of staff PPE, and extensive cleaning of exam room while observing appropriate contact time as indicated for disinfecting solutions.

## 2021-09-27 NOTE — Patient Instructions (Addendum)
For cough:  Try adding famotidine (pepcid) twice daily for the next few weeks. This is available over the counter.  For blood pressure:  Start losartan 50mg  daily   Schedule an appointment for a physical and labs.  Come fasting for labs.   Managing Your Hypertension Hypertension, also called high blood pressure, is when the force of the blood pressing against the walls of the arteries is too strong. Arteries are blood vessels that carry blood from your heart throughout your body. Hypertension forces the heart to work harder to pump blood and may cause the arteries to become narrow or stiff. Understanding blood pressure readings Your personal target blood pressure may vary depending on your medical conditions, your age, and other factors. A blood pressure reading includes a higher number over a lower number. Ideally, your blood pressure should be below 120/80. You should know that: The first, or top, number is called the systolic pressure. It is a measure of the pressure in your arteries as your heart beats. The second, or bottom number, is called the diastolic pressure. It is a measure of the pressure in your arteries as the heart relaxes. Blood pressure is classified into four stages. Based on your blood pressure reading, your health care provider may use the following stages to determine what type of treatment you need, if any. Systolic pressure and diastolic pressure are measured in a unit called mmHg. Normal Systolic pressure: below 656. Diastolic pressure: below 80. Elevated Systolic pressure: 812-751. Diastolic pressure: below 80. Hypertension stage 1 Systolic pressure: 700-174. Diastolic pressure: 94-49. Hypertension stage 2 Systolic pressure: 675 or above. Diastolic pressure: 90 or above. How can this condition affect me? Managing your hypertension is an important responsibility. Over time, hypertension can damage the arteries and decrease blood flow to important parts of the  body, including the brain, heart, and kidneys. Having untreated or uncontrolled hypertension can lead to: A heart attack. A stroke. A weakened blood vessel (aneurysm). Heart failure. Kidney damage. Eye damage. Metabolic syndrome. Memory and concentration problems. Vascular dementia. What actions can I take to manage this condition? Hypertension can be managed by making lifestyle changes and possibly by taking medicines. Your health care provider will help you make a plan to bring your blood pressure within a normal range. Nutrition  Eat a diet that is high in fiber and potassium, and low in salt (sodium), added sugar, and fat. An example eating plan is called the Dietary Approaches to Stop Hypertension (DASH) diet. To eat this way: Eat plenty of fresh fruits and vegetables. Try to fill one-half of your plate at each meal with fruits and vegetables. Eat whole grains, such as whole-wheat pasta, brown rice, or whole-grain bread. Fill about one-fourth of your plate with whole grains. Eat low-fat dairy products. Avoid fatty cuts of meat, processed or cured meats, and poultry with skin. Fill about one-fourth of your plate with lean proteins such as fish, chicken without skin, beans, eggs, and tofu. Avoid pre-made and processed foods. These tend to be higher in sodium, added sugar, and fat. Reduce your daily sodium intake. Most people with hypertension should eat less than 1,500 mg of sodium a day. Lifestyle  Work with your health care provider to maintain a healthy body weight or to lose weight. Ask what an ideal weight is for you. Get at least 30 minutes of exercise that causes your heart to beat faster (aerobic exercise) most days of the week. Activities may include walking, swimming, or biking. Include exercise to  strengthen your muscles (resistance exercise), such as weight lifting, as part of your weekly exercise routine. Try to do these types of exercises for 30 minutes at least 3 days a  week. Do not use any products that contain nicotine or tobacco, such as cigarettes, e-cigarettes, and chewing tobacco. If you need help quitting, ask your health care provider. Control any long-term (chronic) conditions you have, such as high cholesterol or diabetes. Identify your sources of stress and find ways to manage stress. This may include meditation, deep breathing, or making time for fun activities. Alcohol use Do not drink alcohol if: Your health care provider tells you not to drink. You are pregnant, may be pregnant, or are planning to become pregnant. If you drink alcohol: Limit how much you use to: 0-1 drink a day for women. 0-2 drinks a day for men. Be aware of how much alcohol is in your drink. In the [place] one drink equals one 12 oz bottle of beer (355 mL), one 5 oz glass of wine (148 mL), or one 1 oz glass of hard liquor (44 mL). Medicines Your health care provider may prescribe medicine if lifestyle changes are not enough to get your blood pressure under control and if: Your systolic blood pressure is 130 or higher. Your diastolic blood pressure is 80 or higher. Take medicines only as told by your health care provider. Follow the directions carefully. Blood pressure medicines must be taken as told by your health care provider. The medicine does not work as well when you skip doses. Skipping doses also puts you at risk for problems. Monitoring Before you monitor your blood pressure: Do not smoke, drink caffeinated beverages, or exercise within 30 minutes before taking a measurement. Use the bathroom and empty your bladder (urinate). Sit quietly for at least 5 minutes before taking measurements. Monitor your blood pressure at home as told by your health care provider. To do this: Sit with your back straight and supported. Place your feet flat on the floor. Do not cross your legs. Support your arm on a flat surface, such as a table. Make sure your upper arm is at heart  level. Each time you measure, take two or three readings one minute apart and record the results. You may also need to have your blood pressure checked regularly by your health care provider. General information Talk with your health care provider about your diet, exercise habits, and other lifestyle factors that may be contributing to hypertension. Review all the medicines you take with your health care provider because there may be side effects or interactions. Keep all visits as told by your health care provider. Your health care provider can help you create and adjust your plan for managing your high blood pressure. Where to find more information National Heart, Lung, and Blood Institute: https://wilson-eaton.com/ American Heart Association: www.heart.org Contact a health care provider if: You think you are having a reaction to medicines you have taken. You have repeated (recurrent) headaches. You feel dizzy. You have swelling in your ankles. You have trouble with your vision. Get help right away if: You develop a severe headache or confusion. You have unusual weakness or numbness, or you feel faint. You have severe pain in your chest or abdomen. You vomit repeatedly. You have trouble breathing. These symptoms may represent a serious problem that is an emergency. Do not wait to see if the symptoms will go away. Get medical help right away. Call your local emergency services (911 in the [place]  Do not drive yourself to the hospital. Summary Hypertension is when the force of blood pumping through your arteries is too strong. If this condition is not controlled, it may put you at risk for serious complications. Your personal target blood pressure may vary depending on your medical conditions, your age, and other factors. For most people, a normal blood pressure is less than 120/80. Hypertension is managed by lifestyle changes, medicines, or both. Lifestyle changes to help manage hypertension  include losing weight, eating a healthy, low-sodium diet, exercising more, stopping smoking, and limiting alcohol. This information is not intended to replace advice given to you by your health care provider. Make sure you discuss any questions you have with your health care provider. Document Revised: 09/13/2019 Document Reviewed: 07/27/2019 Elsevier Patient Education  2022 [employer].

## 2021-09-27 NOTE — Assessment & Plan Note (Signed)
Blood pressure elevated in clinic with history of elevated blood pressure at home.  We discussed dietary changes which may be helpful for managing his blood pressure.  Adding losartan 50 mg daily.

## 2021-09-27 NOTE — Assessment & Plan Note (Signed)
I suspect this is contributing to his chronic cough.  Recommend addition of famotidine twice daily.

## 2021-10-19 ENCOUNTER — Telehealth: Payer: Self-pay | Admitting: Family Medicine

## 2021-10-19 DIAGNOSIS — Z125 Encounter for screening for malignant neoplasm of prostate: Secondary | ICD-10-CM

## 2021-10-19 DIAGNOSIS — Z0000 Encounter for general adult medical examination without abnormal findings: Secondary | ICD-10-CM

## 2021-10-19 DIAGNOSIS — Z13220 Encounter for screening for lipoid disorders: Secondary | ICD-10-CM

## 2021-10-19 DIAGNOSIS — I10 Essential (primary) hypertension: Secondary | ICD-10-CM

## 2021-10-19 NOTE — Telephone Encounter (Signed)
Patient would like to come for his fasting labs before his appt on 3/9. Can is orders be placed for when he does come. Patient to did not provide a day that he would be coming

## 2021-10-19 NOTE — Telephone Encounter (Signed)
Task completed. Patient has been updated of fasting labs. Aware of clinic's phlebotomist and [facility] schedule and hours.

## 2021-10-26 ENCOUNTER — Encounter: Payer: BC Managed Care – PPO | Admitting: Family Medicine

## 2021-11-13 DIAGNOSIS — Z13220 Encounter for screening for lipoid disorders: Secondary | ICD-10-CM | POA: Diagnosis not present

## 2021-11-13 DIAGNOSIS — I10 Essential (primary) hypertension: Secondary | ICD-10-CM | POA: Diagnosis not present

## 2021-11-13 DIAGNOSIS — Z125 Encounter for screening for malignant neoplasm of prostate: Secondary | ICD-10-CM | POA: Diagnosis not present

## 2021-11-13 DIAGNOSIS — Z0000 Encounter for general adult medical examination without abnormal findings: Secondary | ICD-10-CM | POA: Diagnosis not present

## 2021-11-14 LAB — COMPLETE METABOLIC PANEL WITH GFR
AG Ratio: 2.1 (calc) (ref 1.0–2.5)
ALT: 13 U/L (ref 9–46)
AST: 19 U/L (ref 10–35)
Albumin: 4.4 g/dL (ref 3.6–5.1)
Alkaline phosphatase (APISO): 63 U/L (ref 35–144)
BUN: 13 mg/dL (ref 7–25)
CO2: 27 mmol/L (ref 20–32)
Calcium: 9.4 mg/dL (ref 8.6–10.3)
Chloride: 105 mmol/L (ref 98–110)
Creat: 1.2 mg/dL (ref 0.70–1.30)
Globulin: 2.1 g/dL (calc) (ref 1.9–3.7)
Glucose, Bld: 103 mg/dL — ABNORMAL HIGH (ref 65–99)
Potassium: 4.5 mmol/L (ref 3.5–5.3)
Sodium: 141 mmol/L (ref 135–146)
Total Bilirubin: 1.2 mg/dL (ref 0.2–1.2)
Total Protein: 6.5 g/dL (ref 6.1–8.1)
eGFR: 72 mL/min/{1.73_m2} (ref >=60)

## 2021-11-14 LAB — CBC WITH DIFFERENTIAL/PLATELET
Absolute Monocytes: 561 cells/uL (ref 200–950)
Basophils Absolute: 47 cells/uL (ref 0–200)
Basophils Relative: 0.6 %
Eosinophils Absolute: 213 cells/uL (ref 15–500)
Eosinophils Relative: 2.7 %
HCT: 49.2 % (ref 38.5–50.0)
Hemoglobin: 17 g/dL (ref 13.2–17.1)
Lymphs Abs: 1533 cells/uL (ref 850–3900)
MCH: 30.3 pg (ref 27.0–33.0)
MCHC: 34.6 g/dL (ref 32.0–36.0)
MCV: 87.7 fL (ref 80.0–100.0)
MPV: 11.8 fL (ref 7.5–12.5)
Monocytes Relative: 7.1 %
Neutro Abs: 5546 cells/uL (ref 1500–7800)
Neutrophils Relative %: 70.2 %
Platelets: 185 10*3/uL (ref 140–400)
RBC: 5.61 10*6/uL (ref 4.20–5.80)
RDW: 12.4 % (ref 11.0–15.0)
Total Lymphocyte: 19.4 %
WBC: 7.9 10*3/uL (ref 3.8–10.8)

## 2021-11-14 LAB — LIPID PANEL W/REFLEX DIRECT LDL
Cholesterol: 184 mg/dL (ref <200)
HDL: 54 mg/dL (ref >=40)
LDL Cholesterol (Calc): 116 mg/dL (calc) — ABNORMAL HIGH
Non-HDL Cholesterol (Calc): 130 mg/dL (calc) — ABNORMAL HIGH (ref <130)
Total CHOL/HDL Ratio: 3.4 (calc) (ref <5.0)
Triglycerides: 57 mg/dL (ref <150)

## 2021-11-14 LAB — PSA: PSA: 1.07 ng/mL (ref <=4.00)

## 2021-11-14 LAB — TSH: TSH: 1.62 mIU/L (ref 0.40–4.50)

## 2021-11-15 ENCOUNTER — Encounter: Payer: Self-pay | Admitting: Family Medicine

## 2021-11-15 ENCOUNTER — Ambulatory Visit (INDEPENDENT_AMBULATORY_CARE_PROVIDER_SITE_OTHER): Payer: BC Managed Care – PPO | Admitting: Family Medicine

## 2021-11-15 ENCOUNTER — Other Ambulatory Visit: Payer: Self-pay

## 2021-11-15 VITALS — BP 137/94 | HR 97 | Temp 98.1°F | Ht 72.0 in | Wt 206.0 lb

## 2021-11-15 DIAGNOSIS — Z0000 Encounter for general adult medical examination without abnormal findings: Secondary | ICD-10-CM

## 2021-11-15 DIAGNOSIS — Z1211 Encounter for screening for malignant neoplasm of colon: Secondary | ICD-10-CM | POA: Diagnosis not present

## 2021-11-15 NOTE — Assessment & Plan Note (Signed)
Well adult ?Recent labs reviewed with him today. ?Immunizations: Declines Shingrix and flu vaccine ?Screenings: Referral placed for colonoscopy ?Anticipatory guidance/risk factor reduction: Recommendations per AVS. ?

## 2021-11-15 NOTE — Patient Instructions (Signed)

## 2021-11-15 NOTE — Progress Notes (Signed)
?Jonathan Mcclain - 56 y.o. male MRN [id number]  Date of birth: Feb 23, 1966 ? ?Subjective ?Chief Complaint  ?Patient presents with  ? Annual Exam  ? ? ?HPI ?Jonathan Mcclain is a 56 year old male here today for annual exam.  Reports he is doing well at this time.  He had labs completed prior to visit today which we reviewed.  This did show mild elevation and LDL as well as blood glucose but nothing significant. ? ?His job does keep him fairly active.  He feels like his diet is pretty healthy. ? ?He is a non-smoker.  He rarely consumes alcohol. ? ?He is due for colon cancer screening and would like referral for colonoscopy. ? ?He does not want to have flu or shingles vaccine at this time. ? ?ROS:  A comprehensive ROS was completed and negative except as noted per HPI ? ? ?No Known Allergies ? ?Past Medical History:  ?Diagnosis Date  ? History of kidney stones   ? Hypertension   ? ? ?Past Surgical History:  ?Procedure Laterality Date  ? EXTRACORPOREAL SHOCK WAVE LITHOTRIPSY Left 04/23/2018  ? Procedure: LEFT EXTRACORPOREAL SHOCK WAVE LITHOTRIPSY (ESWL);  Surgeon: Alexis Frock, MD;  Location: [facility];  Service: Urology;  Laterality: Left;  ? LITHOTRIPSY    ? tubes in ears bilat    ? ? ?Social History  ? ?Socioeconomic History  ? Marital status: Married  ?  Spouse name: Not on file  ? Number of children: 6  ? Years of education: Not on file  ? Highest education level: Not on file  ?Occupational History  ? Not on file  ?Tobacco Use  ? Smoking status: Never  ?  Passive exposure: Never  ? Smokeless tobacco: Never  ?Vaping Use  ? Vaping Use: Never used  ?Substance and Sexual Activity  ? Alcohol use: Not Currently  ?  Alcohol/week: 1.0 standard drink  ?  Types: 1 Standard drinks or equivalent per week  ? Drug use: No  ? Sexual activity: Yes  ?  Partners: Female  ?Other Topics Concern  ? Not on file  ?Social History Narrative  ? Not on file  ? ?Social Determinants of Health  ? ?Financial Resource Strain: Not on file  ?Food Insecurity: Not  on file  ?Transportation Needs: Not on file  ?Physical Activity: Not on file  ?Stress: Not on file  ?Social Connections: Not on file  ? ? ?History reviewed. No pertinent family history. ? ?Health Maintenance  ?Topic Date Due  ? COVID-19 Vaccine (1) Never done  ? HIV Screening  Never done  ? Hepatitis C Screening  Never done  ? COLONOSCOPY (Pts 45-46yr Insurance coverage will need to be confirmed)  Never done  ? Zoster Vaccines- Shingrix (1 of 2) Never done  ? INFLUENZA VACCINE  Never done  ? TETANUS/TDAP  01/16/2025  ? HPV VACCINES  Aged Out  ? ? ? ?----------------------------------------------------------------------------------------------------------------------------------------------------------------------------------------------------------------- ?Physical Exam ?BP (!) 137/94 (BP Location: Left Arm, Patient Position: Sitting, Cuff Size: Large)   Pulse 97   Temp 98.1 ?F (36.7 ?C)   Ht 6' (1.829 m)   Wt 206 lb (93.4 kg)   SpO2 97%   BMI 27.94 kg/m?  ? ?Physical Exam ?Constitutional:   ?   Appearance: Normal appearance.  ?Eyes:  ?   General: No scleral icterus. ?Cardiovascular:  ?   Rate and Rhythm: Normal rate and regular rhythm.  ?Pulmonary:  ?   Effort: Pulmonary effort is normal.  ?   Breath sounds: Normal breath  sounds.  ?Musculoskeletal:  ?   Cervical back: Neck supple.  ?Neurological:  ?   General: No focal deficit present.  ?   Mental Status: He is alert.  ?Psychiatric:     ?   Mood and Affect: Mood normal.     ?   Behavior: Behavior normal.  ? ? ?------------------------------------------------------------------------------------------------------------------------------------------------------------------------------------------------------------------- ?Assessment and Plan ? ?Well adult exam ?Well adult ?Recent labs reviewed with him today. ?Immunizations: Declines Shingrix and flu vaccine ?Screenings: Referral placed for colonoscopy ?Anticipatory guidance/risk factor reduction:  Recommendations per AVS. ? ? ?No orders of the defined types were placed in this encounter. ? ? ?Return in about 6 months (around 05/18/2022) for HTN. ? ? ? ?This visit occurred during the SARS-CoV-2 public health emergency.  Safety protocols were in place, including screening questions prior to the visit, additional usage of staff PPE, and extensive cleaning of exam room while observing appropriate contact time as indicated for disinfecting solutions.  ? ?

## 2022-01-03 ENCOUNTER — Ambulatory Visit (AMBULATORY_SURGERY_CENTER): Payer: BC Managed Care – PPO | Admitting: *Deleted

## 2022-01-03 VITALS — Ht 72.0 in | Wt 200.0 lb

## 2022-01-03 DIAGNOSIS — Z1211 Encounter for screening for malignant neoplasm of colon: Secondary | ICD-10-CM

## 2022-01-03 MED ORDER — NA SULFATE-K SULFATE-MG SULF 17.5-3.13-1.6 GM/177ML PO SOLN: 1.0000 | Freq: Once | ORAL | 0 refills | Status: AC

## 2022-01-03 NOTE — Progress Notes (Signed)
No egg or soy allergy known to patient  ?No issues known to pt with past sedation with any surgeries or procedures ?Patient denies ever being told they had issues or difficulty with intubation  ?No FH of Malignant Hyperthermia ?Pt is not on diet pills ?Pt is not on  home 02  ?Pt is not on blood thinners  ?Pt denies issues with constipation  ?No A fib or A flutter ? ?Suprep Coupon to pt in PV today , Code to Pharmacy and  NO PA's for preps discussed with pt In PV today  ?Discussed with pt there will be an out-of-pocket cost for prep and that varies from $0 to 70 +  dollars - pt verbalized understanding  ?Pt instructed to use Singlecare.com or GoodRx for a price reduction on prep  ? ?PV completed over the phone. Pt verified name, DOB, address and insurance during PV today.  ?Pt mailed instruction packet with copy of consent form to read and not return, and instructions.  ?Pt encouraged to call with questions or issues.  ?If pt has My chart, procedure instructions sent via My Chart  ?Insurance confirmed with pt at [facility] today   ?

## 2022-01-09 ENCOUNTER — Encounter: Payer: Self-pay | Admitting: Gastroenterology

## 2022-01-17 ENCOUNTER — Encounter: Payer: Self-pay | Admitting: Gastroenterology

## 2022-01-17 ENCOUNTER — Ambulatory Visit (AMBULATORY_SURGERY_CENTER): Payer: BC Managed Care – PPO | Admitting: Gastroenterology

## 2022-01-17 VITALS — BP 116/82 | HR 65 | Temp 97.5°F | Resp 21 | Ht 72.0 in | Wt 206.0 lb

## 2022-01-17 DIAGNOSIS — K635 Polyp of colon: Secondary | ICD-10-CM | POA: Diagnosis not present

## 2022-01-17 DIAGNOSIS — D122 Benign neoplasm of ascending colon: Secondary | ICD-10-CM | POA: Diagnosis not present

## 2022-01-17 DIAGNOSIS — Z1211 Encounter for screening for malignant neoplasm of colon: Secondary | ICD-10-CM | POA: Diagnosis not present

## 2022-01-17 DIAGNOSIS — D125 Benign neoplasm of sigmoid colon: Secondary | ICD-10-CM

## 2022-01-17 DIAGNOSIS — D123 Benign neoplasm of transverse colon: Secondary | ICD-10-CM

## 2022-01-17 MED ORDER — SODIUM CHLORIDE 0.9 % IV SOLN: 500.0000 mL | Freq: Once | INTRAVENOUS | Status: DC

## 2022-01-17 NOTE — Progress Notes (Signed)
Called to room to assist during endoscopic procedure.  Patient ID and intended procedure confirmed with present staff. Received instructions for my participation in the procedure from the performing physician.  ? ? ?Dr. Lyndel Safe was made aware we did not retrieve Ascending polyp.  maw ?

## 2022-01-17 NOTE — Op Note (Signed)
[facility] ?Patient Name: Jonathan Mcclain ?Procedure Date: 01/17/2022 10:34 AM ?MRN: [id number] ?Endoscopist: Jackquline Denmark , MD ?Age: 56 ?Referring MD:  ?Date of Birth: [birth year] ?Gender: Male ?Account #: [id number] ?Procedure:                Colonoscopy ?Indications:              Screening for colorectal malignant neoplasm ?Medicines:                Monitored Anesthesia Care ?Procedure:                Pre-Anesthesia Assessment: ?                          - Prior to the procedure, a History and Physical  ?                          was performed, and patient medications and  ?                          allergies were reviewed. The patient's tolerance of  ?                          previous anesthesia was also reviewed. The risks  ?                          and benefits of the procedure and the sedation  ?                          options and risks were discussed with the patient.  ?                          All questions were answered, and informed consent  ?                          was obtained. Prior Anticoagulants: The patient has  ?                          taken no previous anticoagulant or antiplatelet  ?                          agents. ASA Grade Assessment: II - A patient with  ?                          mild systemic disease. After reviewing the risks  ?                          and benefits, the patient was deemed in  ?                          satisfactory condition to undergo the procedure. ?                          After obtaining informed consent, the colonoscope  ?  was passed under direct vision. Throughout the  ?                          procedure, the patient's blood pressure, pulse, and  ?                          oxygen saturations were monitored continuously. The  ?                          Olympus CF-[id number] (#[id number]) Colonoscope was  ?                          introduced through the anus and advanced to the 2  ?                          cm into the ileum. The  colonoscopy was performed  ?                          without difficulty. The patient tolerated the  ?                          procedure well. The quality of the bowel  ?                          preparation was good. The terminal ileum, ileocecal  ?                          valve, appendiceal orifice, and rectum were  ?                          photographed. ?Scope In: 10:50:46 AM ?Scope Out: 11:05:11 AM ?Scope Withdrawal Time: 0 hours 12 minutes 23 seconds  ?Total Procedure Duration: 0 hours 14 minutes 25 seconds  ?Findings:                 Two sessile polyps were found in the proximal  ?                          transverse colon and mid ascending colon. The  ?                          polyps were 12m and 4 mm in size. These polyps were  ?                          removed with a cold snare. Resection was complete.  ?                          The smaller 1 mm polyp was not retrieved. ?                          A 6 mm polyp was found in the mid sigmoid colon.  ?                          The  polyp was sessile. The polyp was removed with a  ?                          cold snare. Resection and retrieval were complete. ?                          Non-bleeding internal hemorrhoids were found during  ?                          retroflexion. The hemorrhoids were small and Grade  ?                          I (internal hemorrhoids that do not prolapse). ?                          The terminal ileum appeared normal. ?                          The exam was otherwise without abnormality on  ?                          direct and retroflexion views. ?Complications:            No immediate complications. ?Estimated Blood Loss:     Estimated blood loss: none. ?Impression:               - Two 1 to 4 mm polyps in the proximal transverse  ?                          colon and in the mid ascending colon, removed with  ?                          a cold snare. ?                          - One 6 mm polyp in the mid sigmoid colon, removed   ?                          with a cold snare. ?                          - Non-bleeding internal hemorrhoids. ?                          - The examined portion of the ileum was normal. ?                          - The examination was otherwise normal on direct  ?                          and retroflexion views. ?Recommendation:           - Patient has a contact number available for  ?  emergencies. The signs and symptoms of potential  ?                          delayed complications were discussed with the  ?                          patient. Return to normal activities tomorrow.  ?                          Written discharge instructions were provided to the  ?                          patient. ?                          - Resume previous diet. ?                          - Continue present medications. ?                          - Await pathology results. ?                          - Repeat colonoscopy for surveillance based on  ?                          pathology results. ?                          - The findings and recommendations were discussed  ?                          with the patient's family. ?Jackquline Denmark, MD ?01/17/2022 11:09:22 AM ?This report has been signed electronically. ?

## 2022-01-17 NOTE — Patient Instructions (Signed)
Handout given for polyps.  YOU HAD AN ENDOSCOPIC PROCEDURE TODAY AT THE [facility]:   Refer to the procedure report that was given to you for any specific questions about what was found during the examination.  If the procedure report does not answer your questions, please call your gastroenterologist to clarify.  If you requested that your care partner not be given the details of your procedure findings, then the procedure report has been included in a sealed envelope for you to review at your convenience later.  YOU SHOULD EXPECT: Some feelings of bloating in the abdomen. Passage of more gas than usual.  Walking can help get rid of the air that was put into your GI tract during the procedure and reduce the bloating. If you had a lower endoscopy (such as a colonoscopy or flexible sigmoidoscopy) you may notice spotting of blood in your stool or on the toilet paper. If you underwent a bowel prep for your procedure, you may not have a normal bowel movement for a few days.  Please Note:  You might notice some irritation and congestion in your nose or some drainage.  This is from the oxygen used during your procedure.  There is no need for concern and it should clear up in a day or so.  SYMPTOMS TO REPORT IMMEDIATELY:   Following lower endoscopy (colonoscopy or flexible sigmoidoscopy):  Excessive amounts of blood in the stool  Significant tenderness or worsening of abdominal pains  Swelling of the abdomen that is new, acute  Fever of 100F or higher  For urgent or emergent issues, a gastroenterologist can be reached at any hour by calling [phone]. Do not use MyChart messaging for urgent concerns.    DIET:  We do recommend a small meal at first, but then you may proceed to your regular diet.  Drink plenty of fluids but you should avoid alcoholic beverages for 24 hours.  ACTIVITY:  You should plan to take it easy for the rest of today and you should NOT DRIVE or use heavy  machinery until tomorrow (because of the sedation medicines used during the test).    FOLLOW UP: Our staff will call the number listed on your records 48-72 hours following your procedure to check on you and address any questions or concerns that you may have regarding the information given to you following your procedure. If we do not reach you, we will leave a message.  We will attempt to reach you two times.  During this call, we will ask if you have developed any symptoms of COVID 19. If you develop any symptoms (ie: fever, flu-like symptoms, shortness of breath, cough etc.) before then, please call [phone].  If you test positive for Covid 19 in the 2 weeks post procedure, please call and report this information to us.    If any biopsies were taken you will be contacted by phone or by letter within the next 1-3 weeks.  Please call us at [phone] if you have not heard about the biopsies in 3 weeks.    SIGNATURES/CONFIDENTIALITY: You and/or your care partner have signed paperwork which will be entered into your electronic medical record.  These signatures attest to the fact that that the information above on your After Visit Summary has been reviewed and is understood.  Full responsibility of the confidentiality of this discharge information lies with you and/or your care-partner. 

## 2022-01-17 NOTE — Progress Notes (Signed)
[facility] History and Physical ? ? ?Primary Care Physician:  Luetta Nutting, DO ? ? ?Reason for Procedure:  CRC screening ? ?Plan:    colon ? ? ? ? ?HPI: Jonathan Mcclain is a 56 y.o. male  ?No nausea, vomiting, heartburn, regurgitation, odynophagia or dysphagia.  No significant diarrhea or constipation.  No melena or hematochezia. No unintentional weight loss. No abdominal pain. ? ? ?Past Medical History:  ?Diagnosis Date  ? Allergy   ? SEASONAL  ? Heart murmur   ? "EARLY TEENS"  ? History of kidney stones   ? Hypertension   ? ? ?Past Surgical History:  ?Procedure Laterality Date  ? EXTRACORPOREAL SHOCK WAVE LITHOTRIPSY Left 04/23/2018  ? Procedure: LEFT EXTRACORPOREAL SHOCK WAVE LITHOTRIPSY (ESWL);  Surgeon: Alexis Frock, MD;  Location: [facility];  Service: Urology;  Laterality: Left;  ? LITHOTRIPSY    ? tubes in ears bilat    ? AGE 29  ? ? ?Prior to Admission medications   ?Medication Sig Start Date End Date Taking? Authorizing Provider  ?losartan (COZAAR) 50 MG tablet Take 1 tablet (50 mg total) by mouth daily. 09/27/21  Yes Luetta Nutting, DO  ? ? ?Current Outpatient Medications  ?Medication Sig Dispense Refill  ? losartan (COZAAR) 50 MG tablet Take 1 tablet (50 mg total) by mouth daily. 90 tablet 1  ? ?Current Facility-Administered Medications  ?Medication Dose Route Frequency Provider Last Rate Last Admin  ? 0.9 %  sodium chloride infusion  500 mL Intravenous Once Jackquline Denmark, MD      ? ? ?Allergies as of 01/17/2022  ? (No Known Allergies)  ? ? ?Family History  ?Problem Relation Age of Onset  ? Colon cancer Neg Hx   ? Colon polyps Neg Hx   ? Crohn's disease Neg Hx   ? Esophageal cancer Neg Hx   ? Rectal cancer Neg Hx   ? Stomach cancer Neg Hx   ? ? ?Social History  ? ?Socioeconomic History  ? Marital status: Married  ?  Spouse name: Not on file  ? Number of children: 6  ? Years of education: Not on file  ? Highest education level: Not on file  ?Occupational History  ? Not on file  ?Tobacco Use  ?  Smoking status: Never  ?  Passive exposure: Never  ? Smokeless tobacco: Never  ?Vaping Use  ? Vaping Use: Never used  ?Substance and Sexual Activity  ? Alcohol use: Not Currently  ?  Alcohol/week: 1.0 standard drink  ?  Types: 1 Standard drinks or equivalent per week  ? Drug use: No  ? Sexual activity: Yes  ?  Partners: Female  ?Other Topics Concern  ? Not on file  ?Social History Narrative  ? Not on file  ? ?Social Determinants of Health  ? ?Financial Resource Strain: Not on file  ?Food Insecurity: Not on file  ?Transportation Needs: Not on file  ?Physical Activity: Not on file  ?Stress: Not on file  ?Social Connections: Not on file  ?Intimate Partner Violence: Not on file  ? ? ?Review of Systems: ?Positive for none ?All other review of systems negative except as mentioned in the HPI. ? ?Physical Exam: ?Vital signs in last 24 hours: ?'@VSRANGES'$ @ ?  ?General:   Alert,  Well-developed, well-nourished, pleasant and cooperative in NAD ?Lungs:  Clear throughout to auscultation.   ?Heart:  Regular rate and rhythm; no murmurs, clicks, rubs,  or gallops. ?Abdomen:  Soft, nontender and nondistended. Normal bowel sounds.   ?  Neuro/Psych:  Alert and cooperative. Normal mood and affect. A and O x 3 ? ? ? ?No significant changes were identified.  The patient continues to be an appropriate candidate for the planned procedure and anesthesia. ? ? ?Carmell Austria, MD. ?[facility] ?01/17/2022 10:48 AM@ ? ?

## 2022-01-17 NOTE — Progress Notes (Signed)
VSS, transported to PACU °

## 2022-01-21 ENCOUNTER — Telehealth: Payer: Self-pay

## 2022-01-21 NOTE — Telephone Encounter (Signed)
?  Follow up Call- ? ? ?  01/17/2022  ? 10:32 AM  ?Call back number  ?Post procedure Call Back phone  # [phone]  ?Permission to leave phone message Yes  ?  ? ?Patient questions: ? ?Do you have a fever, pain , or abdominal swelling? No. ?Pain Score  0 * ? ?Have you tolerated food without any problems? Yes.   ? ?Have you been able to return to your normal activities? Yes.   ? ?Do you have any questions about your discharge instructions: ?Diet   No. ?Medications  No. ?Follow up visit  No. ? ?Do you have questions or concerns about your Care? No. ? ?Actions: ?* If pain score is 4 or above: ?No action needed, pain <4. ? ? ? ? ?

## 2022-01-27 ENCOUNTER — Encounter: Payer: Self-pay | Admitting: Gastroenterology

## 2022-03-08 IMAGING — DX DG KNEE COMPLETE 4+V*L*
4 series · 4 of 4 positions shown · non-contrast
Comparison: None.

CLINICAL DATA: Chondromalacia of patellofemoral joint, left. Left
knee pain.

EXAM:
RIGHT KNEE - 1-2 VIEW; LEFT KNEE - COMPLETE 4+ VIEW

[knee tunnel]
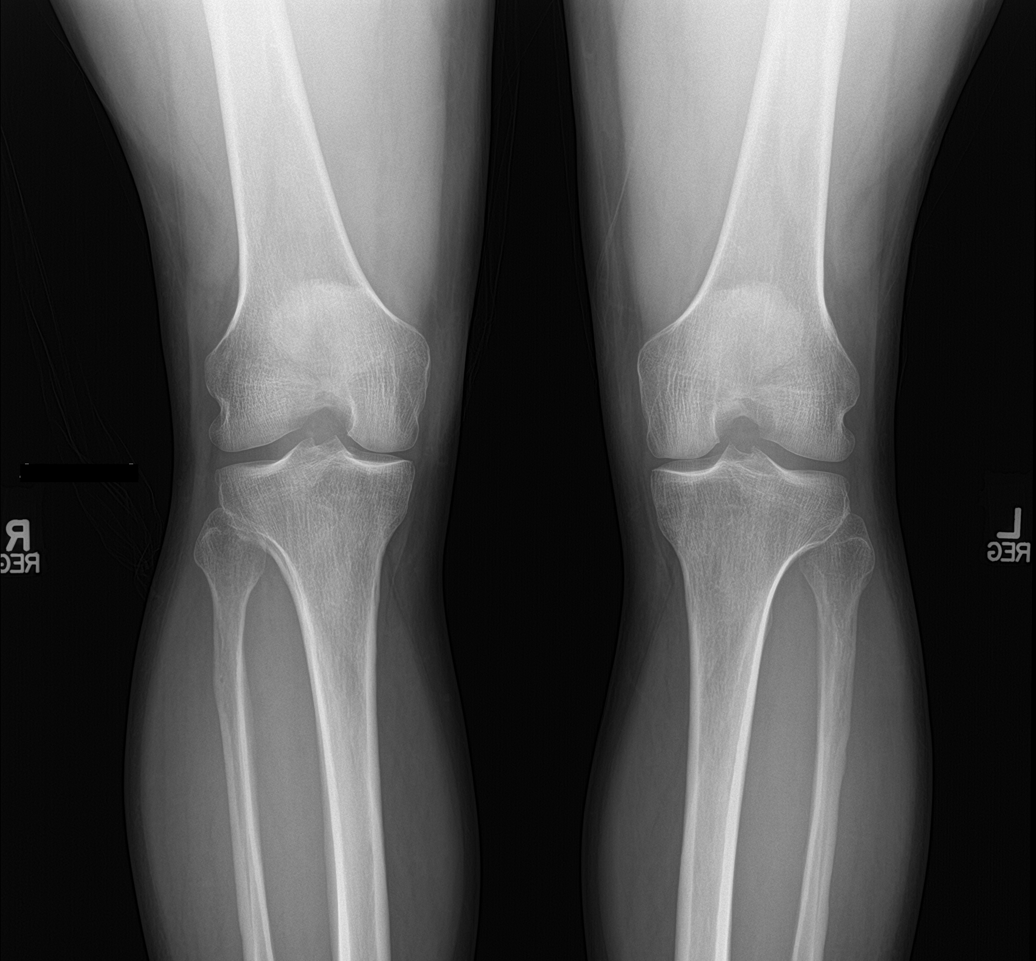

[knee lat]
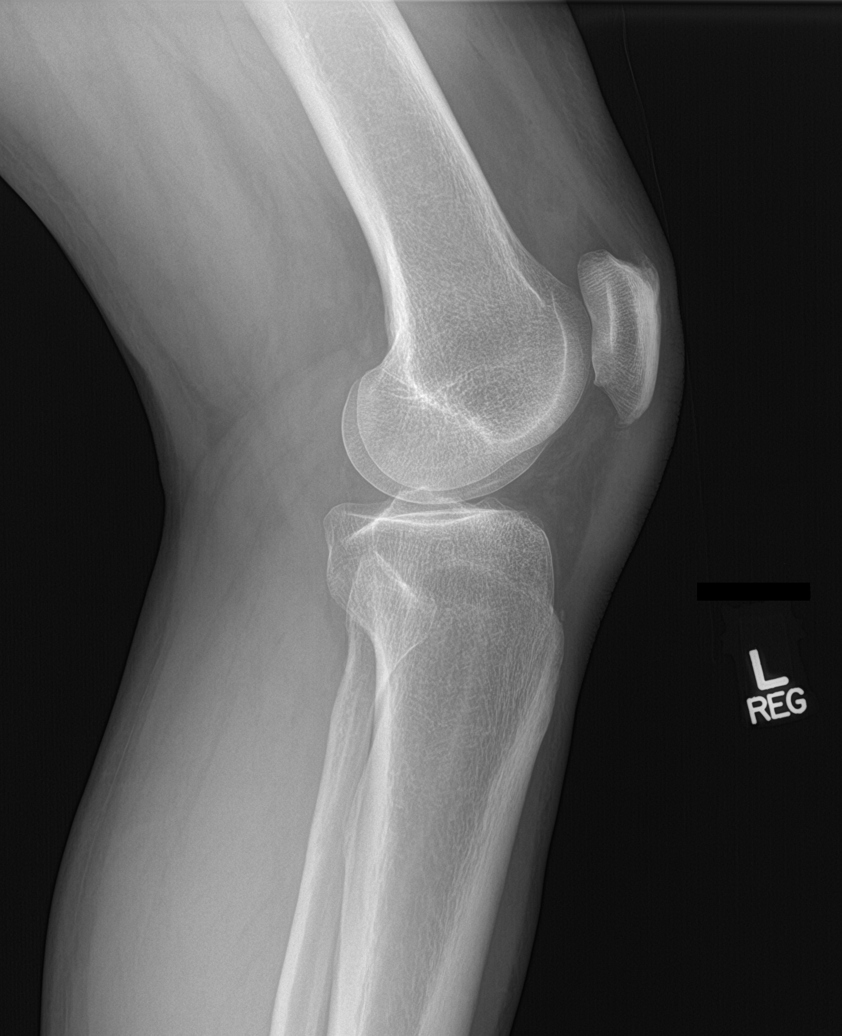

[knee ap bilat standing]
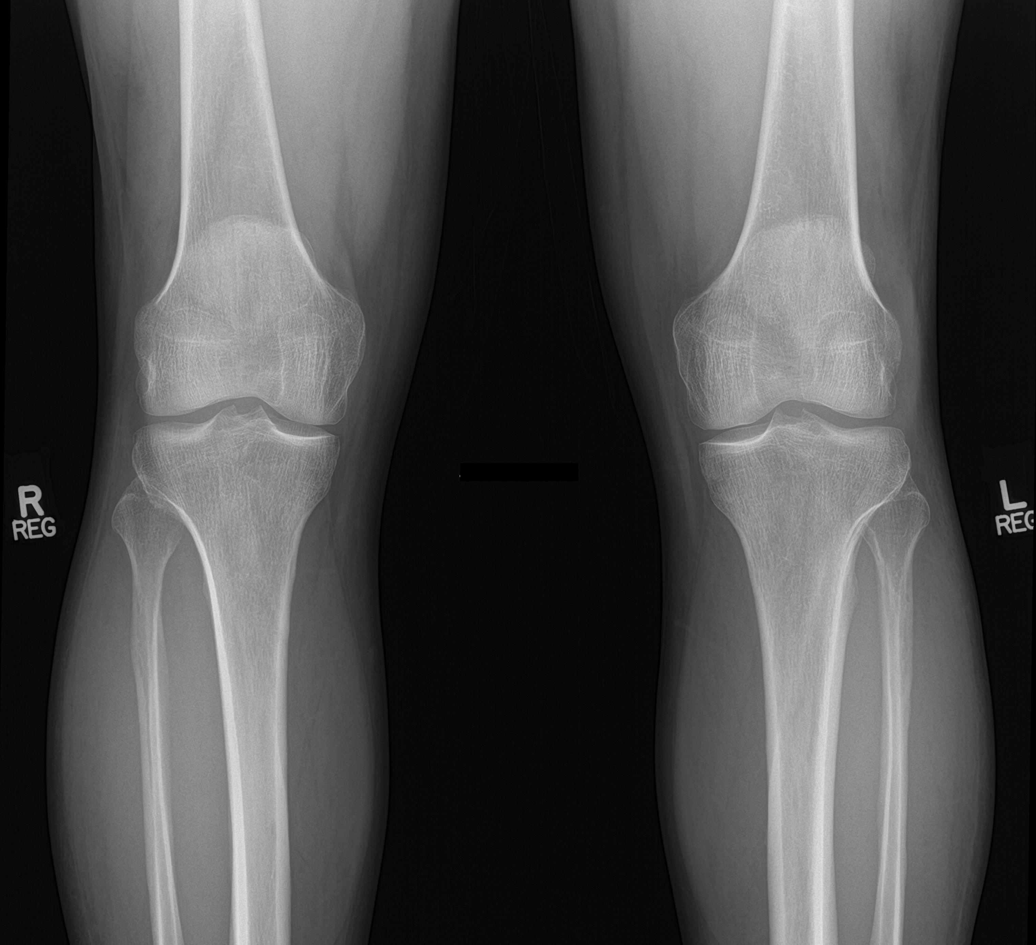

[knee sunrise standing]
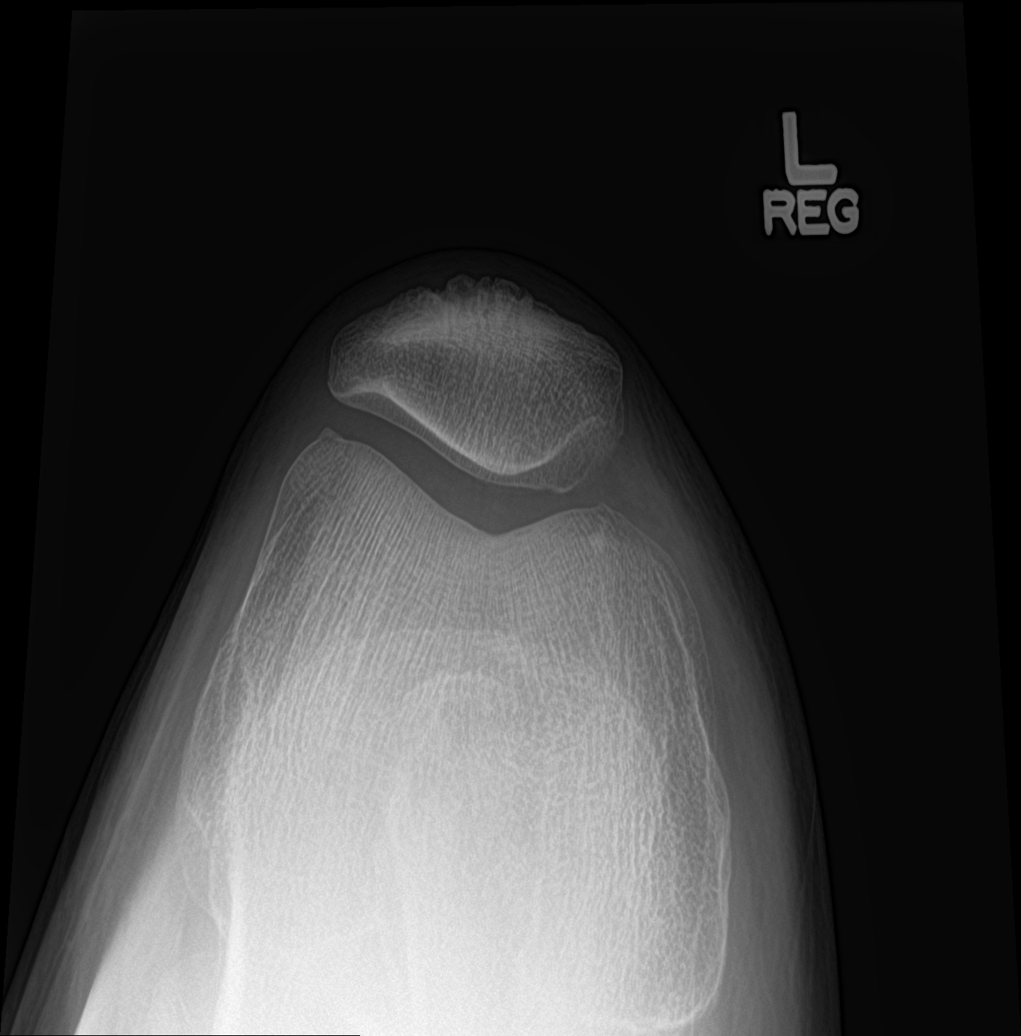

[4 of 4 positions shown; findings below may reference images not displayed]

FINDINGS: Standing AP and tunnel view both knees obtained, with lateral
patellar sunrise views of the left knee.

Left knee: Normal alignment. The joint spaces are preserved. There
is trace patellofemoral spurring. Trace spurring of the tibial
spines. Minimal knee joint effusion. Small quadriceps tendon
enthesophyte. No fracture, erosion, or focal bone abnormality.

Right knee for comparison demonstrates trace spurring of the tibial
spines. Preserved tibiofemoral joint spaces and alignment.
IMPRESSION: Trace left knee joint effusion with minimal patellofemoral spurring
and spurring of the tibial spines.

## 2022-04-10 ENCOUNTER — Other Ambulatory Visit: Payer: Self-pay

## 2022-04-10 MED ORDER — LOSARTAN POTASSIUM 50 MG PO TABS: 50.0000 mg | ORAL_TABLET | Freq: Every day | ORAL | 1 refills | Status: DC

## 2022-05-20 ENCOUNTER — Encounter: Payer: Self-pay | Admitting: Family Medicine

## 2022-05-20 ENCOUNTER — Ambulatory Visit (INDEPENDENT_AMBULATORY_CARE_PROVIDER_SITE_OTHER): Payer: BC Managed Care – PPO | Admitting: Family Medicine

## 2022-05-20 DIAGNOSIS — I10 Essential (primary) hypertension: Secondary | ICD-10-CM

## 2022-05-20 NOTE — Assessment & Plan Note (Addendum)
BP elevated initially.  Recheck of BP is improved.  Continue losartan at current strength.  F/u in 6 months.

## 2022-05-20 NOTE — Progress Notes (Signed)
Jonathan Mcclain - 56 y.o. male MRN [id number]  Date of birth: [birth year]  Subjective Chief Complaint  Patient presents with   Hypertension    HPI Jonathan Mcclain is a 56 y.o. male here today for follow up visit.    Continues on losartan for management of HTN.  BP is elevated today.  He is feeling well today.  Denies side effects from medication.  Has not had chest pain, shortness of breath, palpitations, headache  or vision changes.  He does stay fairly active.  Diet could be better.  ROS:  A comprehensive ROS was completed and negative except as noted per HPI  No Known Allergies  Past Medical History:  Diagnosis Date   Allergy    SEASONAL   Heart murmur    "EARLY TEENS"   History of kidney stones    Hypertension     Past Surgical History:  Procedure Laterality Date   EXTRACORPOREAL SHOCK WAVE LITHOTRIPSY Left 04/23/2018   Procedure: LEFT EXTRACORPOREAL SHOCK WAVE LITHOTRIPSY (ESWL);  Surgeon: Alexis Frock, MD;  Location: [facility];  Service: Urology;  Laterality: Left;   LITHOTRIPSY     tubes in ears bilat     AGE 40    Social History   Socioeconomic History   Marital status: Married    Spouse name: Not on file   Number of children: 6   Years of education: Not on file   Highest education level: Not on file  Occupational History   Not on file  Tobacco Use   Smoking status: Never    Passive exposure: Never   Smokeless tobacco: Never  Vaping Use   Vaping Use: Never used  Substance and Sexual Activity   Alcohol use: Not Currently    Alcohol/week: 1.0 standard drink of alcohol    Types: 1 Standard drinks or equivalent per week   Drug use: No   Sexual activity: Yes    Partners: Female  Other Topics Concern   Not on file  Social History Narrative   Not on file   Social Determinants of Health   Financial Resource Strain: Not on file  Food Insecurity: Not on file  Transportation Needs: Not on file  Physical Activity: Not on file  Stress: Not on file  Social  Connections: Not on file    Family History  Problem Relation Age of Onset   Colon cancer Neg Hx    Colon polyps Neg Hx    Crohn's disease Neg Hx    Esophageal cancer Neg Hx    Rectal cancer Neg Hx    Stomach cancer Neg Hx     Health Maintenance  Topic Date Due   COVID-19 Vaccine (1) 10/10/2022 (Originally 01/31/1967)   Zoster Vaccines- Shingrix (1 of 2) 10/10/2022 (Originally 08/02/2017)   INFLUENZA VACCINE  12/08/2022 (Originally 04/09/2022)   Hepatitis C Screening  05/21/2023 (Originally 08/02/1985)   HIV Screening  05/21/2023 (Originally 08/02/1982)   TETANUS/TDAP  01/16/2025   COLONOSCOPY (Pts 45-6yr Insurance coverage will need to be confirmed)  01/17/2029   HPV VACCINES  Aged Out     ----------------------------------------------------------------------------------------------------------------------------------------------------------------------------------------------------------------- Physical Exam BP 129/83 (BP Location: Left Arm, Patient Position: Sitting, Cuff Size: Normal)   Pulse 71   Ht 6' (1.829 m)   Wt 193 lb (87.5 kg)   SpO2 96%   BMI 26.18 kg/m   Physical Exam Constitutional:      Appearance: Normal appearance.  Eyes:     General: No scleral icterus. Cardiovascular:     Rate  and Rhythm: Normal rate and regular rhythm.  Pulmonary:     Effort: Pulmonary effort is normal.     Breath sounds: Normal breath sounds.  Musculoskeletal:     Cervical back: Neck supple.  Neurological:     General: No focal deficit present.     Mental Status: He is alert.  Psychiatric:        Mood and Affect: Mood normal.        Behavior: Behavior normal.     ------------------------------------------------------------------------------------------------------------------------------------------------------------------------------------------------------------------- Assessment and Plan  Essential hypertension BP elevated initially.  Recheck of BP is improved.   Continue losartan at current strength.  F/u in 6 months.     No orders of the defined types were placed in this encounter.   Return in about 6 months (around 11/18/2022) for Annual exam/Fasting labs. .    This visit occurred during the SARS-CoV-2 public health emergency.  Safety protocols were in place, including screening questions prior to the visit, additional usage of staff PPE, and extensive cleaning of exam room while observing appropriate contact time as indicated for disinfecting solutions.

## 2022-10-10 ENCOUNTER — Other Ambulatory Visit: Payer: Self-pay | Admitting: Family Medicine

## 2022-11-18 ENCOUNTER — Encounter: Payer: Self-pay | Admitting: Family Medicine

## 2022-11-18 ENCOUNTER — Ambulatory Visit (INDEPENDENT_AMBULATORY_CARE_PROVIDER_SITE_OTHER): Payer: BC Managed Care – PPO | Admitting: Family Medicine

## 2022-11-18 VITALS — BP 134/96 | HR 80 | Ht 72.0 in | Wt 198.0 lb

## 2022-11-18 DIAGNOSIS — Z13220 Encounter for screening for lipoid disorders: Secondary | ICD-10-CM

## 2022-11-18 DIAGNOSIS — Z0000 Encounter for general adult medical examination without abnormal findings: Secondary | ICD-10-CM | POA: Diagnosis not present

## 2022-11-18 DIAGNOSIS — I10 Essential (primary) hypertension: Secondary | ICD-10-CM

## 2022-11-18 DIAGNOSIS — Z125 Encounter for screening for malignant neoplasm of prostate: Secondary | ICD-10-CM

## 2022-11-18 MED ORDER — LOSARTAN POTASSIUM 50 MG PO TABS
50.0000 mg | ORAL_TABLET | Freq: Every day | ORAL | 3 refills | Status: DC
Start: 2022-11-18 — End: 2024-05-20

## 2022-11-18 NOTE — Assessment & Plan Note (Addendum)
Well adult Orders Placed This Encounter  Procedures   COMPLETE METABOLIC PANEL WITH GFR   CBC with Differential   Lipid Panel w/reflex Direct LDL   PSA  Screenings: per lab orders Immunizations: Declines Anticipatory guidance/Risk factor reduction:  Recommendations per AVS.  

## 2022-11-18 NOTE — Progress Notes (Signed)
Jonathan Mcclain - 57 y.o. male MRN [id number]  Date of birth: [birth year]  Subjective Chief Complaint  Patient presents with   Hypertension   Annual Exam    HPI Jonathan Mcclain is a 57 y.o. male here today for annual exam.   He reports that he is doing pretty well.  Remains on losartan '50mg'$  for management of HTN.  He is tolerating this well at current strength.  He has not had chest pain, shortness of breath, palpitations, headache or vision changes.   Reports that he is dealing with a kidney stone currently. He has had these before and this is similar.  Was having severe pain that has pretty much subsided for the most part.  He does have a urologist.   He remains fairly active.  He   He is a non-smoker.  Rare EtOH use.   He is UTD on colon cancer screening.  He declines vaccinations at this time.   Review of Systems  Constitutional:  Negative for chills, fever, malaise/fatigue and weight loss.  HENT:  Negative for congestion, ear pain and sore throat.   Eyes:  Negative for blurred vision, double vision and pain.  Respiratory:  Negative for cough and shortness of breath.   Cardiovascular:  Negative for chest pain and palpitations.  Gastrointestinal:  Negative for abdominal pain, blood in stool, constipation, heartburn and nausea.  Genitourinary:  Negative for dysuria and urgency.  Musculoskeletal:  Negative for joint pain and myalgias.  Neurological:  Negative for dizziness and headaches.  Endo/Heme/Allergies:  Does not bruise/bleed easily.  Psychiatric/Behavioral:  Negative for depression. The patient is not nervous/anxious and does not have insomnia.       No Known Allergies  Past Medical History:  Diagnosis Date   Allergy    SEASONAL   Heart murmur    "EARLY TEENS"   History of kidney stones    Hypertension     Past Surgical History:  Procedure Laterality Date   EXTRACORPOREAL SHOCK WAVE LITHOTRIPSY Left 04/23/2018   Procedure: LEFT EXTRACORPOREAL SHOCK WAVE  LITHOTRIPSY (ESWL);  Surgeon: Alexis Frock, MD;  Location: [facility];  Service: Urology;  Laterality: Left;   LITHOTRIPSY     tubes in ears bilat     AGE 31    Social History   Socioeconomic History   Marital status: Married    Spouse name: Not on file   Number of children: 6   Years of education: Not on file   Highest education level: Not on file  Occupational History   Not on file  Tobacco Use   Smoking status: Never    Passive exposure: Never   Smokeless tobacco: Never  Vaping Use   Vaping Use: Never used  Substance and Sexual Activity   Alcohol use: Not Currently    Alcohol/week: 1.0 standard drink of alcohol    Types: 1 Standard drinks or equivalent per week   Drug use: No   Sexual activity: Yes    Partners: Female  Other Topics Concern   Not on file  Social History Narrative   Not on file   Social Determinants of Health   Financial Resource Strain: Not on file  Food Insecurity: Not on file  Transportation Needs: Not on file  Physical Activity: Not on file  Stress: Not on file  Social Connections: Not on file    Family History  Problem Relation Age of Onset   Colon cancer Neg Hx    Colon polyps Neg Hx  Crohn's disease Neg Hx    Esophageal cancer Neg Hx    Rectal cancer Neg Hx    Stomach cancer Neg Hx     Health Maintenance  Topic Date Due   INFLUENZA VACCINE  12/08/2022 (Originally 04/09/2022)   Hepatitis C Screening  05/21/2023 (Originally 08/02/1985)   HIV Screening  05/21/2023 (Originally 08/02/1982)   Zoster Vaccines- Shingrix (1 of 2) 05/21/2023 (Originally 08/02/2017)   COVID-19 Vaccine (1) 12/04/2023 (Originally [birth year]/1968)   DTaP/Tdap/Td (3 - Td or Tdap) 01/16/2025   COLONOSCOPY (Pts 45-76yr Insurance coverage will need to be confirmed)  01/17/2029   HPV VACCINES  Aged Out      ----------------------------------------------------------------------------------------------------------------------------------------------------------------------------------------------------------------- Physical Exam BP (!) 134/96 (BP Location: Left Arm, Patient Position: Sitting, Cuff Size: Normal)   Pulse 80   Ht 6' (1.829 m)   Wt 198 lb (89.8 kg)   SpO2 96%   BMI 26.85 kg/m   Physical Exam Constitutional:      General: He is not in acute distress. HENT:     Head: Normocephalic and atraumatic.     Right Ear: Tympanic membrane and external ear normal.     Left Ear: Tympanic membrane and external ear normal.  Eyes:     General: No scleral icterus. Neck:     Thyroid: No thyromegaly.  Cardiovascular:     Rate and Rhythm: Normal rate and regular rhythm.     Heart sounds: Normal heart sounds.  Pulmonary:     Effort: Pulmonary effort is normal.     Breath sounds: Normal breath sounds.  Abdominal:     General: Bowel sounds are normal. There is no distension.     Palpations: Abdomen is soft.     Tenderness: There is no abdominal tenderness. There is no guarding.  Musculoskeletal:     Cervical back: Normal range of motion.  Lymphadenopathy:     Cervical: No cervical adenopathy.  Skin:    General: Skin is warm and dry.     Findings: No rash.  Neurological:     Mental Status: He is alert and oriented to person, place, and time.     Cranial Nerves: No cranial nerve deficit.     Motor: No abnormal muscle tone.  Psychiatric:        Mood and Affect: Mood normal.        Behavior: Behavior normal.     ------------------------------------------------------------------------------------------------------------------------------------------------------------------------------------------------------------------- Assessment and Plan  Well adult exam Well adult Orders Placed This Encounter  Procedures   COMPLETE METABOLIC PANEL WITH GFR   CBC with Differential    Lipid Panel w/reflex Direct LDL   PSA  Screenings: per lab orders Immunizations:  Declines Anticipatory guidance/Risk factor reduction:  Recommendations per AVS.    Meds ordered this encounter  Medications   losartan (COZAAR) 50 MG tablet    Sig: Take 1 tablet (50 mg total) by mouth daily.    Dispense:  90 tablet    Refill:  3    No follow-ups on file.    This visit occurred during the SARS-CoV-2 public health emergency.  Safety protocols were in place, including screening questions prior to the visit, additional usage of staff PPE, and extensive cleaning of exam room while observing appropriate contact time as indicated for disinfecting solutions.

## 2022-11-18 NOTE — Patient Instructions (Signed)

## 2022-11-22 DIAGNOSIS — Z125 Encounter for screening for malignant neoplasm of prostate: Secondary | ICD-10-CM | POA: Diagnosis not present

## 2022-11-22 DIAGNOSIS — I10 Essential (primary) hypertension: Secondary | ICD-10-CM | POA: Diagnosis not present

## 2022-11-22 DIAGNOSIS — Z13220 Encounter for screening for lipoid disorders: Secondary | ICD-10-CM | POA: Diagnosis not present

## 2022-11-22 DIAGNOSIS — Z0000 Encounter for general adult medical examination without abnormal findings: Secondary | ICD-10-CM | POA: Diagnosis not present

## 2022-11-23 LAB — LIPID PANEL W/REFLEX DIRECT LDL
Cholesterol: 184 mg/dL (ref <200)
HDL: 59 mg/dL (ref >=40)
LDL Cholesterol (Calc): 111 mg/dL (calc) — ABNORMAL HIGH
Non-HDL Cholesterol (Calc): 125 mg/dL (calc) (ref <130)
Total CHOL/HDL Ratio: 3.1 (calc) (ref <5.0)
Triglycerides: 48 mg/dL (ref <150)

## 2022-11-23 LAB — CBC WITH DIFFERENTIAL/PLATELET
Absolute Monocytes: 469 cells/uL (ref 200–950)
Basophils Absolute: 67 cells/uL (ref 0–200)
Basophils Relative: 1 %
Eosinophils Absolute: 288 cells/uL (ref 15–500)
Eosinophils Relative: 4.3 %
HCT: 49.3 % (ref 38.5–50.0)
Hemoglobin: 17.3 g/dL — ABNORMAL HIGH (ref 13.2–17.1)
Lymphs Abs: 1367 cells/uL (ref 850–3900)
MCH: 30.8 pg (ref 27.0–33.0)
MCHC: 35.1 g/dL (ref 32.0–36.0)
MCV: 87.7 fL (ref 80.0–100.0)
MPV: 11.6 fL (ref 7.5–12.5)
Monocytes Relative: 7 %
Neutro Abs: 4509 cells/uL (ref 1500–7800)
Neutrophils Relative %: 67.3 %
Platelets: 164 10*3/uL (ref 140–400)
RBC: 5.62 10*6/uL (ref 4.20–5.80)
RDW: 12.7 % (ref 11.0–15.0)
Total Lymphocyte: 20.4 %
WBC: 6.7 10*3/uL (ref 3.8–10.8)

## 2022-11-23 LAB — COMPLETE METABOLIC PANEL WITH GFR
AG Ratio: 2 (calc) (ref 1.0–2.5)
ALT: 14 U/L (ref 9–46)
AST: 16 U/L (ref 10–35)
Albumin: 4.5 g/dL (ref 3.6–5.1)
Alkaline phosphatase (APISO): 50 U/L (ref 35–144)
BUN: 18 mg/dL (ref 7–25)
CO2: 26 mmol/L (ref 20–32)
Calcium: 9.6 mg/dL (ref 8.6–10.3)
Chloride: 106 mmol/L (ref 98–110)
Creat: 1.15 mg/dL (ref 0.70–1.30)
Globulin: 2.2 g/dL (calc) (ref 1.9–3.7)
Glucose, Bld: 128 mg/dL — ABNORMAL HIGH (ref 65–99)
Potassium: 4.3 mmol/L (ref 3.5–5.3)
Sodium: 142 mmol/L (ref 135–146)
Total Bilirubin: 0.9 mg/dL (ref 0.2–1.2)
Total Protein: 6.7 g/dL (ref 6.1–8.1)
eGFR: 75 mL/min/{1.73_m2} (ref >=60)

## 2022-11-23 LAB — PSA: PSA: 1.11 ng/mL (ref <=4.00)

## 2023-02-15 ENCOUNTER — Emergency Department (HOSPITAL_BASED_OUTPATIENT_CLINIC_OR_DEPARTMENT_OTHER)
Admission: EM | Admit: 2023-02-15 | Discharge: 2023-02-15 | Disposition: A | Discharge status: Home / Self Care | Payer: BC Managed Care – PPO | Attending: Emergency Medicine | Admitting: Emergency Medicine

## 2023-02-15 ENCOUNTER — Emergency Department (HOSPITAL_BASED_OUTPATIENT_CLINIC_OR_DEPARTMENT_OTHER): Payer: BC Managed Care – PPO

## 2023-02-15 ENCOUNTER — Other Ambulatory Visit: Payer: Self-pay

## 2023-02-15 DIAGNOSIS — I10 Essential (primary) hypertension: Secondary | ICD-10-CM | POA: Diagnosis not present

## 2023-02-15 DIAGNOSIS — N132 Hydronephrosis with renal and ureteral calculous obstruction: Secondary | ICD-10-CM

## 2023-02-15 DIAGNOSIS — Z79899 Other long term (current) drug therapy: Secondary | ICD-10-CM | POA: Insufficient documentation

## 2023-02-15 DIAGNOSIS — R109 Unspecified abdominal pain: Secondary | ICD-10-CM | POA: Diagnosis not present

## 2023-02-15 DIAGNOSIS — N202 Calculus of kidney with calculus of ureter: Secondary | ICD-10-CM | POA: Diagnosis not present

## 2023-02-15 DIAGNOSIS — N281 Cyst of kidney, acquired: Secondary | ICD-10-CM | POA: Diagnosis not present

## 2023-02-15 LAB — CBC
HCT: 47.9 % (ref 39.0–52.0)
Hemoglobin: 16.5 g/dL (ref 13.0–17.0)
MCH: 30.3 pg (ref 26.0–34.0)
MCHC: 34.4 g/dL (ref 30.0–36.0)
MCV: 87.9 fL (ref 80.0–100.0)
Platelets: 154 10*3/uL (ref 150–400)
RBC: 5.45 MIL/uL (ref 4.22–5.81)
RDW: 13.4 % (ref 11.5–15.5)
WBC: 6.8 10*3/uL (ref 4.0–10.5)
nRBC: 0 % (ref 0.0–0.2)

## 2023-02-15 LAB — BASIC METABOLIC PANEL
Anion gap: 9 (ref 5–15)
BUN: 16 mg/dL (ref 6–20)
CO2: 24 mmol/L (ref 22–32)
Calcium: 9.2 mg/dL (ref 8.9–10.3)
Chloride: 106 mmol/L (ref 98–111)
Creatinine, Ser: 1.07 mg/dL (ref 0.61–1.24)
GFR, Estimated: >60 mL/min (ref >60)
Glucose, Bld: 111 mg/dL — ABNORMAL HIGH (ref 70–99)
Potassium: 4.1 mmol/L (ref 3.5–5.1)
Sodium: 139 mmol/L (ref 135–145)

## 2023-02-15 LAB — URINALYSIS, ROUTINE W REFLEX MICROSCOPIC
Bacteria, UA: NONE SEEN
Bilirubin Urine: NEGATIVE
Glucose, UA: NEGATIVE mg/dL
Ketones, ur: NEGATIVE mg/dL
Leukocytes,Ua: NEGATIVE
Nitrite: NEGATIVE
RBC / HPF: >50 RBC/hpf (ref 0–5)
Specific Gravity, Urine: 1.027 (ref 1.005–1.030)
pH: 6 (ref 5.0–8.0)

## 2023-02-15 MED ORDER — KETOROLAC TROMETHAMINE 10 MG PO TABS: 10.0000 mg | ORAL_TABLET | Freq: Four times a day (QID) | ORAL | 0 refills | Status: DC | PRN

## 2023-02-15 MED ORDER — TAMSULOSIN HCL 0.4 MG PO CAPS: 0.4000 mg | ORAL_CAPSULE | Freq: Every day | ORAL | 0 refills | Status: DC

## 2023-02-15 NOTE — ED Provider Notes (Signed)
[place] EMERGENCY DEPARTMENT AT [facility] Provider Note   CSN: [id number] Arrival date & time: 02/15/23  1316     History  Chief Complaint  Patient presents with   Flank Pain    Jonathan Mcclain is a 57 y.o. male.   Flank Pain  Patient presents with right-sided flank pain.  Began this morning.  Feels like previous kidney stones.  Urination without fever.  Has had multiple previous stones.    Past Medical History:  Diagnosis Date   Allergy    SEASONAL   Heart murmur    "EARLY TEENS"   History of kidney stones    Hypertension     Home Medications Prior to Admission medications   Medication Sig Start Date End Date Taking? Authorizing Provider  ketorolac (TORADOL) 10 MG tablet Take 1 tablet (10 mg total) by mouth every 6 (six) hours as needed. No more than 5 days in a row. 02/15/23  Yes Benjiman Core, MD  tamsulosin (FLOMAX) 0.4 MG CAPS capsule Take 1 capsule (0.4 mg total) by mouth daily. 02/15/23  Yes Benjiman Core, MD  losartan (COZAAR) 50 MG tablet Take 1 tablet (50 mg total) by mouth daily. 11/18/22   Everrett Coombe, DO      Allergies    Patient has no known allergies.    Review of Systems   Review of Systems  Genitourinary:  Positive for flank pain.    Physical Exam Updated Vital Signs BP (!) 160/107   Pulse 70   Temp 98.1 F (36.7 C) (Oral)   Resp 17   Ht 6' (1.829 m)   Wt 90.7 kg   SpO2 94%   BMI 27.12 kg/m  Physical Exam Vitals and nursing note reviewed.  Eyes:     Pupils: Pupils are equal, round, and reactive to light.  Cardiovascular:     Rate and Rhythm: Normal rate.  Neurological:     Mental Status: He is alert.     ED Results / Procedures / Treatments   Labs (all labs ordered are listed, but only abnormal results are displayed) Labs Reviewed  URINALYSIS, ROUTINE W REFLEX MICROSCOPIC - Abnormal; Notable for the following components:      Result Value   Hgb urine dipstick LARGE (*)    Protein, ur TRACE (*)    All  other components within normal limits  BASIC METABOLIC PANEL - Abnormal; Notable for the following components:   Glucose, Bld 111 (*)    All other components within normal limits  CBC    EKG None  Radiology CT Renal Stone Study  Result Date: 02/15/2023 CLINICAL DATA:  Right flank pain. EXAM: CT ABDOMEN AND PELVIS WITHOUT CONTRAST TECHNIQUE: Multidetector CT imaging of the abdomen and pelvis was performed following the standard protocol without IV contrast. RADIATION DOSE REDUCTION: This exam was performed according to the departmental dose-optimization program which includes automated exposure control, adjustment of the mA and/or kV according to patient size and/or use of iterative reconstruction technique. COMPARISON:  X-ray 04/23/2018 FINDINGS: Lower chest: Mild linear opacity lung bases likely scar or atelectasis. No pleural effusion. Small hiatal hernia. Hepatobiliary: On this non IV contrast exam, liver is grossly preserved. Gallbladder is nondilated. Pancreas: Unremarkable. No pancreatic ductal dilatation or surrounding inflammatory changes. Spleen: Normal in size without focal abnormality. Adrenals/Urinary Tract: The adrenal glands are preserved. Several nonobstructing left-sided renal stones are identified. Least 4 stones are seen measuring up to 3 mm. No left ureteral stone identified. There is an exophytic upper pole  cystic lesion from the left kidney which has some marginal calcifications measuring diameter maximally approaching 2.8 cm. Hounsfield units of 3. Bosniak 2 lesion. No additional follow-up. No left-sided renal collecting system dilatation. Preserved contours of the urinary bladder. Right kidney has mild collecting system dilatation down to level of the mid ureter where on coronal series 5, image 49 there is a 6 mm stone in the ureter measured cephalocaudal. This is at the level of L4. No additional right renal or ureteral stones. There are some exophytic cystic foci from the right  kidney. This includes an anterior focus measuring 11 mm with Hounsfield units of 10. Lateral focus with diameter of 17 mm and Hounsfield unit of 18. This latter focus has a small peripheral high density nodule margin. More indeterminate. Stomach/Bowel: On this non oral contrast exam, large bowel has a normal course and caliber with scattered mild stool. Normal appendix. Stomach is nondilated. Small bowel is nondilated. Vascular/Lymphatic: Aortic atherosclerosis. No enlarged abdominal or pelvic lymph nodes. Reproductive: Prostate is unremarkable. Other: Small fat containing umbilical and right inguinal hernias. No free air or free fluid. Musculoskeletal: Scattered mild degenerative changes along the spine. IMPRESSION: 6 mm stone in the mid right ureter with mild collecting system dilatation. Nonobstructing left-sided renal stones. Bilateral renal cystic foci with Bosniak 1 and 2 lesions. However the 17 mm echogenic focus laterally from the upper pole of the right kidney has a slightly nodular contour and is more indeterminate. Would recommend a follow up postcontrast examination when appropriate to exclude abnormal enhancement. Small hiatal hernia. Electronically Signed   By: Karen Kays M.D.   On: 02/15/2023 15:02    Procedures Procedures    Medications Ordered in ED Medications - No data to display  ED Course/ Medical Decision Making/ A&P                             Medical Decision Making Amount and/or Complexity of Data Reviewed Labs: ordered.  Risk Prescription drug management.  Initial differential diagnosis include kidney stone, UTI, musculoskeletal pain. Patient with right flank pain with history of kidney stones.  Does not need pain medicine at this time.  Reviewed blood work and does not show infection.  Good renal function.  Reviewed and independently interpreted CT scan.  Has ureteral stone on the right.  Moderate sized at 5 to 6 mm.  Does cause hydronephrosis.  Patient has had  Toradol orally with good pain relief in the past.  Discussed with him and will give him a few pills.  Instructed to only take for up to 5 days.  Also due to size and proximal stone will give Flomax.  Has seen [facility] in the past.  Will discharge home.          Final Clinical Impression(s) / ED Diagnoses Final diagnoses:  Ureteral stone with hydronephrosis    Rx / DC Orders ED Discharge Orders          Ordered    ketorolac (TORADOL) 10 MG tablet  Every 6 hours PRN        02/15/23 1509    tamsulosin (FLOMAX) 0.4 MG CAPS capsule  Daily        02/15/23 1509              Benjiman Core, MD 02/15/23 1526

## 2023-02-15 NOTE — ED Notes (Signed)
RN reviewed discharge instructions with pt. Pt verbalized understanding and had no further questions. VSS upon discharge.  

## 2023-02-15 NOTE — ED Triage Notes (Addendum)
A+Ox4. Ambulatory to triage.  Complains of right flank pain. Spent the day working outside Thursday and Friday. This AM pain started. Hx of several stones. Could not get in with [facility] yesterday. Has urinated this morning. Denies need for pain meds in triage.

## 2023-02-17 ENCOUNTER — Other Ambulatory Visit: Payer: Self-pay | Admitting: Urology

## 2023-02-17 DIAGNOSIS — N201 Calculus of ureter: Secondary | ICD-10-CM | POA: Diagnosis not present

## 2023-02-17 DIAGNOSIS — R8271 Bacteriuria: Secondary | ICD-10-CM | POA: Diagnosis not present

## 2023-02-18 NOTE — Progress Notes (Signed)
Preop phone call completed. Arrival time 34. Wife, Juliette Alcide [phone], to drive. No Toradol or other nonsteroidals after 02/19/2023. Take blood pressure meds evening before.NPO after midnight to include candy, gum, and mints.

## 2023-02-18 NOTE — H&P (Signed)
Jonathan Mcclain is a 57 year old male who presents today to discuss a right ureteral stone. He presented to the ED 2 days ago after acute onset of right flank pain. He does have a history of stones in the past.   His urine was positive for RBCs otherwise no sign of infection. Creatinine was 1.07.   I reviewed Jonathan Mcclain CT scan. He has mild hydronephrosis on the right tracking down to a 6 mm stone in the proximal right ureter. there are a few punctate calcifications on the left   Today Jonathan Mcclain continues to endorse some right-sided pain and occasional nausea. He denies subjective fever symptoms or fevers at home.     ALLERGIES: No Allergies    MEDICATIONS: Ketorolac Tromethamine  Flomax     GU PSH: ESWL - 2019, about 1987       PSH Notes: Ear Surgery  brain surgery   NON-GU PSH: None   GU PMH: Renal calculus - 2019, Kidney stone on left side, - 2014 Ureteral calculus - 2019, Left, At the left [facility]. I discussed MET, ESWL and URS and will get him set up for ESWL this week. I reviewed the risks of ESWL including bleeding, infection, injury to the kidney or adjacent structures, failure to fragment the stone, need for ancillary procedures, thrombotic events, cardiac arrhythmias and sedation complications. , - 2019, Distal Ureteral Stone On The Right, - 2014, Calculus of left ureter, - 2014    NON-GU PMH: Asthma, Asthma - 2014 Cardiac murmur, unspecified, Murmurs - 2014 Personal history of other diseases of the circulatory system, History of hypertension - 2014 Encounter for general adult medical examination without abnormal findings, Encounter for preventive health examination GERD Hypertension    FAMILY HISTORY: 3 daughters - No Family History 3 Son's - No Family History Breast Cancer - Runs In Family Death In The Family Father - Runs In Family Family Health Status Number - Runs In Family Hypertension - Runs In Family   SOCIAL HISTORY: Marital Status: Married Preferred  Language: English; Race: White Current Smoking Status: Patient has never smoked.   Tobacco Use Assessment Completed: Used Tobacco in last 30 days? Does not use smokeless tobacco. Has never drank.  Drinks 4+ caffeinated drinks per day. Has not had a blood transfusion.     Notes: Never A Smoker, Marital History - Currently Married, Caffeine Use, Alcohol Use, Occupation:   REVIEW OF SYSTEMS:    GU Review Male:   Patient denies frequent urination, hard to postpone urination, burning/ pain with urination, get up at night to urinate, leakage of urine, stream starts and stops, trouble starting your stream, have to strain to urinate , erection problems, and penile pain.  Gastrointestinal (Upper):   Patient denies nausea, vomiting, and indigestion/ heartburn.  Gastrointestinal (Lower):   Patient denies diarrhea and constipation.  Constitutional:   Patient denies fever, night sweats, weight loss, and fatigue.  Skin:   Patient denies skin rash/ lesion and itching.  Eyes:   Patient denies blurred vision and double vision.  Ears/ Nose/ Throat:   Patient denies sore throat and sinus problems.  Hematologic/Lymphatic:   Patient denies swollen glands and easy bruising.  Cardiovascular:   Patient denies leg swelling and chest pains.  Respiratory:   Patient denies cough and shortness of breath.  Endocrine:   Patient denies excessive thirst.  Musculoskeletal:   Patient denies back pain and joint pain.  Neurological:   Patient denies headaches and dizziness.  Psychologic:   Patient denies depression  and anxiety.   VITAL SIGNS: None   MULTI-SYSTEM PHYSICAL EXAMINATION:    Constitutional: Well-nourished. No physical deformities. Normally developed. Good grooming.  Neck: Neck symmetrical, not swollen. Normal tracheal position.  Respiratory: No labored breathing, no use of accessory muscles.   Cardiovascular: Normal temperature, normal extremity pulses, no swelling, no varicosities.  Lymphatic: No  enlargement of neck, axillae, groin.  Skin: No paleness, no jaundice, no cyanosis. No lesion, no ulcer, no rash.  Neurologic / Psychiatric: Oriented to time, oriented to place, oriented to person. No depression, no anxiety, no agitation.  Gastrointestinal: No mass, no tenderness, no rigidity, non obese abdomen.  Eyes: Normal conjunctivae. Normal eyelids.  Ears, Nose, Mouth, and Throat: Left ear no scars, no lesions, no masses. Right ear no scars, no lesions, no masses. Nose no scars, no lesions, no masses. Normal hearing. Normal lips.  Musculoskeletal: Normal gait and station of head and neck.     Complexity of Data:  Source Of History:  Patient, Medical Record Summary  Urine Test Review:   Urinalysis  X-Ray Review: Outside CT: Reviewed Films. Reviewed Report. Discussed With Patient.  KUB: Reviewed Films. Discussed With Patient.     PROCEDURES:         KUB - [id number]  A single view of the abdomen is obtained. I believe I can see calcification around the L4 vertebrae on the right medially, which is consistent with a medial deviation of his right ureter and stone on the CT scan from June 8.      Patient confirmed No Neulasta OnPro Device.           Urinalysis w/Scope Dipstick Dipstick Cont'd Micro  Color: Amber Bilirubin: Neg mg/dL WBC/hpf: 0 - 5/hpf  Appearance: Clear Ketones: Neg mg/dL RBC/hpf: 10 - 16/XWR  Specific Gravity: 1.025 Blood: 1+ ery/uL Bacteria: Few (10-25/hpf)  pH: 6.0 Protein: Trace mg/dL Cystals: NS (Not Seen)  Glucose: Neg mg/dL Urobilinogen: 0.2 mg/dL Casts: NS (Not Seen)    Nitrites: Neg Trichomonas: Not Present    Leukocyte Esterase: Neg leu/uL Mucous: Present      Epithelial Cells: 0 - 5/hpf      Yeast: NS (Not Seen)      Sperm: Not Present    ASSESSMENT:      ICD-10 Details  1 GU:   Ureteral calculus - N20.1 Acute, Threat to Bodily Function  2   Ureteral obstruction secondary to calculous - N13.2 Acute, Threat to Bodily Function   PLAN:            Orders Labs CULTURE, URINE  X-Rays: KUB          Schedule         Document Letter(s):  Created for Patient: Clinical Summary         Notes:   We discussed the management of urinary stones. These options include observation, ureteroscopy, and shockwave lithotripsy. We discussed which options are relevant to these particular stones. We discussed the natural history of stones as well as the complications of untreated stones and the impact on quality of life without treatment as well as with each of the above listed treatments. We also discussed the efficacy of each treatment in its ability to clear the stone burden. With any of these management options I discussed the signs and symptoms of infection and the need for emergent treatment should these be experienced. For each option we discussed the ability of each procedure to clear the patient of their stone burden.   For observation  I described the risks which include but are not limited to silent renal damage, life-threatening infection, need for emergent surgery, failure to pass stone, and pain.   For ureteroscopy I described the risks which include heart attack, stroke, pulmonary embolus, death, bleeding, infection, damage to contiguous structures, positioning injury, ureteral stricture, ureteral avulsion, ureteral injury, need for ureteral stent, inability to perform ureteroscopy, need for an interval procedure, inability to clear stone burden, stent discomfort and pain.   For shockwave lithotripsy I described the risks which include arrhythmia, kidney contusion, kidney hemorrhage, need for transfusion, pain, inability to break up stone, inability to pass stone fragments, Steinstrasse, infection associated with obstructing stones, need for different surgical procedure, need for repeat shockwave lithotripsy.   Jonathan Mcclain is interested in ESWL which I feel is reasonable. Green sheet submitted. We reviewed warning signs and symptoms for which she  should notify us sooner.

## 2023-02-21 ENCOUNTER — Encounter (HOSPITAL_BASED_OUTPATIENT_CLINIC_OR_DEPARTMENT_OTHER): Payer: Self-pay | Admitting: Urology

## 2023-02-21 ENCOUNTER — Encounter (HOSPITAL_BASED_OUTPATIENT_CLINIC_OR_DEPARTMENT_OTHER)
Admission: RE | Disposition: A | Discharge status: Home / Self Care | Payer: Self-pay | Source: Home / Self Care | Attending: Urology

## 2023-02-21 ENCOUNTER — Ambulatory Visit (HOSPITAL_COMMUNITY): Payer: BC Managed Care – PPO

## 2023-02-21 ENCOUNTER — Ambulatory Visit (HOSPITAL_BASED_OUTPATIENT_CLINIC_OR_DEPARTMENT_OTHER)
Admission: RE | Admit: 2023-02-21 | Discharge: 2023-02-21 | Disposition: A | Discharge status: Home / Self Care | Payer: BC Managed Care – PPO | Attending: Urology | Admitting: Urology

## 2023-02-21 ENCOUNTER — Other Ambulatory Visit: Payer: Self-pay

## 2023-02-21 DIAGNOSIS — N201 Calculus of ureter: Secondary | ICD-10-CM | POA: Diagnosis not present

## 2023-02-21 DIAGNOSIS — I10 Essential (primary) hypertension: Secondary | ICD-10-CM | POA: Diagnosis not present

## 2023-02-21 DIAGNOSIS — N132 Hydronephrosis with renal and ureteral calculous obstruction: Secondary | ICD-10-CM | POA: Diagnosis not present

## 2023-02-21 HISTORY — PX: EXTRACORPOREAL SHOCK WAVE LITHOTRIPSY: SHX1557

## 2023-02-21 SURGERY — LITHOTRIPSY, ESWL
Anesthesia: LOCAL | Laterality: Right

## 2023-02-21 MED ORDER — DIAZEPAM 5 MG PO TABS
10.0000 mg | ORAL_TABLET | ORAL | Status: AC
  Administered 2023-02-21: 10 mg via ORAL

## 2023-02-21 MED ORDER — DIAZEPAM 5 MG PO TABS
ORAL_TABLET | ORAL | Status: AC
  Filled 2023-02-21: qty 2

## 2023-02-21 MED ORDER — SODIUM CHLORIDE 0.9 % IV SOLN: INTRAVENOUS | Status: DC

## 2023-02-21 MED ORDER — DIPHENHYDRAMINE HCL 25 MG PO CAPS
25.0000 mg | ORAL_CAPSULE | ORAL | Status: AC
  Administered 2023-02-21: 25 mg via ORAL

## 2023-02-21 MED ORDER — DIPHENHYDRAMINE HCL 25 MG PO CAPS
ORAL_CAPSULE | ORAL | Status: AC
  Filled 2023-02-21: qty 1

## 2023-02-21 MED ORDER — CIPROFLOXACIN HCL 500 MG PO TABS
500.0000 mg | ORAL_TABLET | ORAL | Status: AC
  Administered 2023-02-21: 500 mg via ORAL

## 2023-02-21 MED ORDER — CIPROFLOXACIN HCL 500 MG PO TABS
ORAL_TABLET | ORAL | Status: AC
  Filled 2023-02-21: qty 1

## 2023-02-21 NOTE — Discharge Instructions (Signed)
I have reviewed discharge instructions in detail with the patient. They will follow-up with me or their physician as scheduled. My nurse will also be calling the patients as per protocol.   

## 2023-02-21 NOTE — Interval H&P Note (Signed)
History and Physical Interval Note:  02/21/2023 7:30 AM  Jonathan Mcclain  has presented today for surgery, with the diagnosis of RIGHT URETERAL STONE.  The various methods of treatment have been discussed with the patient and family. After consideration of risks, benefits and other options for treatment, the patient has consented to  Procedure(s) with comments: RIGHT EXTRACORPOREAL SHOCK WAVE LITHOTRIPSY (ESWL) (Right) - 75 MINUTES NEEDED FOR CASE as a surgical intervention.  The patient's history has been reviewed, patient examined, no change in status, stable for surgery.  I have reviewed the patient's chart and labs.  Questions were answered to the patient's satisfaction.     Keary Hanak A Demon Volante

## 2023-02-24 ENCOUNTER — Encounter (HOSPITAL_BASED_OUTPATIENT_CLINIC_OR_DEPARTMENT_OTHER): Payer: Self-pay | Admitting: Urology

## 2023-03-12 DIAGNOSIS — N202 Calculus of kidney with calculus of ureter: Secondary | ICD-10-CM | POA: Diagnosis not present

## 2024-05-11 ENCOUNTER — Encounter: Payer: Self-pay | Admitting: Sports Medicine

## 2024-05-11 ENCOUNTER — Other Ambulatory Visit: Payer: Self-pay | Admitting: Family Medicine

## 2024-05-12 ENCOUNTER — Telehealth: Payer: Self-pay

## 2024-05-12 ENCOUNTER — Other Ambulatory Visit: Payer: Self-pay | Admitting: Family Medicine

## 2024-05-12 DIAGNOSIS — I10 Essential (primary) hypertension: Secondary | ICD-10-CM

## 2024-05-12 DIAGNOSIS — Z125 Encounter for screening for malignant neoplasm of prostate: Secondary | ICD-10-CM

## 2024-05-12 DIAGNOSIS — Z13220 Encounter for screening for lipoid disorders: Secondary | ICD-10-CM

## 2024-05-12 DIAGNOSIS — Z0000 Encounter for general adult medical examination without abnormal findings: Secondary | ICD-10-CM

## 2024-05-12 NOTE — Telephone Encounter (Signed)
 Copied from CRM #[id number]. Topic: Clinical - Request for Lab/Test Order >> May 12, 2024  2:59 PM Miquel SAILOR wrote: Reason for CRM: Patient requesting full panel labs orders for 09/11 visit. Needs call back [phone]

## 2024-05-12 NOTE — Telephone Encounter (Signed)
 Pls contact pt to schedule HTN & fasting lab appt with Dr. Alvia. Not seen since 11/18/2022. Thx

## 2024-05-12 NOTE — Telephone Encounter (Unsigned)
 Copied from CRM [id number]. Topic: Clinical - Medication Refill >> May 12, 2024  2:56 PM Miquel SAILOR wrote: Medication: losartan  (COZAAR ) 50 MG tablet   Has the patient contacted their pharmacy? Yes (Agent: If no, request that the patient contact the pharmacy for the refill. If patient does not wish to contact the pharmacy document the reason why and proceed with request.) (Agent: If yes, when and what did the pharmacy advise?)  This is the patient's preferred pharmacy:  [id number] - [place], [place] - [address] [place] [address] Phone: [phone] Fax: [phone]    Is this the correct pharmacy for this prescription? Yes If no, delete pharmacy and type the correct one.   Has the prescription been filled recently? Yes  Is the patient out of the medication? Yes needs someto hold off till 09/11 visit  Has the patient been seen for an appointment in the last year OR does the patient have an upcoming appointment? Yes  Can we respond through MyChart? Yes  Agent: Please be advised that Rx refills may take up to 3 business days. We ask that you follow-up with your pharmacy.

## 2024-05-14 NOTE — Telephone Encounter (Signed)
 Patient informed.

## 2024-05-14 NOTE — Addendum Note (Signed)
 Addended by: Mozes Sagar E on: 05/14/2024 01:04 PM   Modules accepted: Orders

## 2024-05-14 NOTE — Telephone Encounter (Signed)
 Patient needs an appointment

## 2024-05-18 LAB — CBC WITH DIFFERENTIAL/PLATELET
Basophils Absolute: 0.1 x10E3/uL (ref 0.0–0.2)
Basos: 1 %
EOS (ABSOLUTE): 0.2 x10E3/uL (ref 0.0–0.4)
Eos: 3 %
Hematocrit: 48.2 % (ref 37.5–51.0)
Hemoglobin: 16.4 g/dL (ref 13.0–17.7)
Immature Grans (Abs): 0 x10E3/uL (ref 0.0–0.1)
Immature Granulocytes: 0 %
Lymphocytes Absolute: 1.3 x10E3/uL (ref 0.7–3.1)
Lymphs: 20 %
MCH: 30.7 pg (ref 26.6–33.0)
MCHC: 34 g/dL (ref 31.5–35.7)
MCV: 90 fL (ref 79–97)
Monocytes Absolute: 0.4 x10E3/uL (ref 0.1–0.9)
Monocytes: 6 %
Neutrophils Absolute: 4.6 x10E3/uL (ref 1.4–7.0)
Neutrophils: 70 %
Platelets: 184 x10E3/uL (ref 150–450)
RBC: 5.34 x10E6/uL (ref 4.14–5.80)
RDW: 12.8 % (ref 11.6–15.4)
WBC: 6.6 x10E3/uL (ref 3.4–10.8)

## 2024-05-18 LAB — CMP14+EGFR
ALT: 14 IU/L (ref 0–44)
AST: 23 IU/L (ref 0–40)
Albumin: 4.2 g/dL (ref 3.8–4.9)
Alkaline Phosphatase: 59 IU/L (ref 44–121)
BUN/Creatinine Ratio: 12 (ref 9–20)
BUN: 11 mg/dL (ref 6–24)
Bilirubin Total: 0.8 mg/dL (ref 0.0–1.2)
CO2: 21 mmol/L (ref 20–29)
Calcium: 9.1 mg/dL (ref 8.7–10.2)
Chloride: 102 mmol/L (ref 96–106)
Creatinine, Ser: 0.93 mg/dL (ref 0.76–1.27)
Globulin, Total: 2.1 g/dL (ref 1.5–4.5)
Glucose: 101 mg/dL — ABNORMAL HIGH (ref 70–99)
Potassium: 4.2 mmol/L (ref 3.5–5.2)
Sodium: 138 mmol/L (ref 134–144)
Total Protein: 6.3 g/dL (ref 6.0–8.5)
eGFR: 96 mL/min/1.73 (ref >59)

## 2024-05-18 LAB — LIPID PANEL WITH LDL/HDL RATIO
Cholesterol, Total: 183 mg/dL (ref 100–199)
HDL: 58 mg/dL (ref >39)
LDL Chol Calc (NIH): 113 mg/dL — ABNORMAL HIGH (ref 0–99)
LDL/HDL Ratio: 1.9 ratio (ref 0.0–3.6)
Triglycerides: 61 mg/dL (ref 0–149)
VLDL Cholesterol Cal: 12 mg/dL (ref 5–40)

## 2024-05-18 LAB — PSA: Prostate Specific Ag, Serum: 1 ng/mL (ref 0.0–4.0)

## 2024-05-20 ENCOUNTER — Ambulatory Visit (INDEPENDENT_AMBULATORY_CARE_PROVIDER_SITE_OTHER): Admitting: Family Medicine

## 2024-05-20 ENCOUNTER — Encounter: Payer: Self-pay | Admitting: Family Medicine

## 2024-05-20 VITALS — BP 162/96 | HR 82 | Temp 98.5°F | Resp 20 | Ht 72.0 in | Wt 200.0 lb

## 2024-05-20 DIAGNOSIS — I10 Essential (primary) hypertension: Secondary | ICD-10-CM | POA: Diagnosis not present

## 2024-05-20 MED ORDER — LOSARTAN POTASSIUM 50 MG PO TABS: 50.0000 mg | ORAL_TABLET | Freq: Every day | ORAL | 3 refills | Status: DC

## 2024-05-20 MED ORDER — LOSARTAN POTASSIUM 50 MG PO TABS: 50.0000 mg | ORAL_TABLET | Freq: Every day | ORAL | 3 refills | Status: AC

## 2024-05-20 NOTE — Progress Notes (Signed)
 Jonathan Mcclain - 58 y.o. male MRN [id number]  Date of birth: 1965/12/06  Subjective Chief Complaint  Patient presents with   Medication Refill   Results    HPI Jonathan Mcclain is a 58 y.o. male here today for follow up visit.   He reports he is doing well at this time.  He has been prescribed losartan  for management of hypertension.  He was this for approximately 1 week.  He denies side effects from medication at current strength.  He has not had any episodes of chest pain, shortness of breath, palpitations, headaches or vision changes.  ROS:  A comprehensive ROS was completed and negative except as noted per HPI   No Known Allergies  Past Medical History:  Diagnosis Date   Allergy    SEASONAL   Heart murmur    EARLY TEENS   History of kidney stones    Hypertension     Past Surgical History:  Procedure Laterality Date   EXTRACORPOREAL SHOCK WAVE LITHOTRIPSY Left 04/23/2018   Procedure: LEFT EXTRACORPOREAL SHOCK WAVE LITHOTRIPSY (ESWL);  Surgeon: Alvaro Hummer, MD;  Location: [facility];  Service: Urology;  Laterality: Left;   EXTRACORPOREAL SHOCK WAVE LITHOTRIPSY Right 02/21/2023   Procedure: RIGHT EXTRACORPOREAL SHOCK WAVE LITHOTRIPSY (ESWL);  Surgeon: Gaston Hamilton, MD;  Location: [facility];  Service: Urology;  Laterality: Right;  75 MINUTES NEEDED FOR CASE   LITHOTRIPSY     tubes in ears bilat     AGE 8    Social History   Socioeconomic History   Marital status: Married    Spouse name: Not on file   Number of children: 6   Years of education: Not on file   Highest education level: Not on file  Occupational History   Not on file  Tobacco Use   Smoking status: Never    Passive exposure: Never   Smokeless tobacco: Never  Vaping Use   Vaping status: Never Used  Substance and Sexual Activity   Alcohol use: Not Currently    Alcohol/week: 1.0 standard drink of alcohol    Types: 1 Standard drinks or equivalent per week   Drug use: No   Sexual  activity: Yes    Partners: Female  Other Topics Concern   Not on file  Social History Narrative   Not on file   Social Drivers of Health   Financial Resource Strain: Not on file  Food Insecurity: Not on file  Transportation Needs: Not on file  Physical Activity: Not on file  Stress: Not on file  Social Connections: Not on file    Family History  Problem Relation Age of Onset   Colon cancer Neg Hx    Colon polyps Neg Hx    Crohn's disease Neg Hx    Esophageal cancer Neg Hx    Rectal cancer Neg Hx    Stomach cancer Neg Hx     Health Maintenance  Topic Date Due   HIV Screening  Never done   Hepatitis C Screening  Never done   Hepatitis B Vaccines 19-59 Average Risk (1 of 3 - 19+ 3-dose series) Never done   Pneumococcal Vaccine: 50+ Years (1 of 1 - PCV) Never done   Zoster Vaccines- Shingrix (1 of 2) Never done   Influenza Vaccine  Never done   COVID-19 Vaccine (1 - 2024-25 season) Never done   DTaP/Tdap/Td (3 - Td or Tdap) 01/16/2025   Colonoscopy  01/17/2029   HPV VACCINES  Aged Out  Meningococcal B Vaccine  Aged Out     ----------------------------------------------------------------------------------------------------------------------------------------------------------------------------------------------------------------- Physical Exam BP (!) 162/96 (BP Location: Left Arm, Patient Position: Sitting, Cuff Size: Normal)   Pulse 82   Temp 98.5 F (36.9 C) (Oral)   Resp 20   Ht 6' (1.829 m)   Wt 200 lb (90.7 kg)   SpO2 96%   BMI 27.12 kg/m   Physical Exam Constitutional:      Appearance: Normal appearance.  Eyes:     General: No scleral icterus. Cardiovascular:     Rate and Rhythm: Normal rate and regular rhythm.  Pulmonary:     Effort: Pulmonary effort is normal.     Breath sounds: Normal breath sounds.  Neurological:     General: No focal deficit present.     Mental Status: He is alert.  Psychiatric:        Mood and Affect: Mood normal.         Behavior: Behavior normal.     ------------------------------------------------------------------------------------------------------------------------------------------------------------------------------------------------------------------- Assessment and Plan  Essential hypertension Blood pressure is elevated today however he has been out of medication for about a week.  Losartan  renewed.  He will continue to monitor blood pressure at home.     Meds ordered this encounter  Medications   DISCONTD: losartan  (COZAAR ) 50 MG tablet    Sig: Take 1 tablet (50 mg total) by mouth daily.    Dispense:  90 tablet    Refill:  3   losartan  (COZAAR ) 50 MG tablet    Sig: Take 1 tablet (50 mg total) by mouth daily.    Dispense:  90 tablet    Refill:  3    Return in about 1 year (around 05/20/2025) for Hypertension.

## 2024-05-20 NOTE — Assessment & Plan Note (Addendum)
 Blood pressure is elevated today however he has been out of medication for about a week.  Losartan  renewed.  He will continue to monitor blood pressure at home.

## 2024-09-27 ENCOUNTER — Emergency Department (HOSPITAL_BASED_OUTPATIENT_CLINIC_OR_DEPARTMENT_OTHER): Admitting: Radiology

## 2024-09-27 ENCOUNTER — Emergency Department (HOSPITAL_BASED_OUTPATIENT_CLINIC_OR_DEPARTMENT_OTHER)
Admission: EM | Admit: 2024-09-27 | Discharge: 2024-09-27 | Disposition: A | Discharge status: Home / Self Care | Attending: Emergency Medicine | Admitting: Emergency Medicine

## 2024-09-27 ENCOUNTER — Emergency Department (HOSPITAL_BASED_OUTPATIENT_CLINIC_OR_DEPARTMENT_OTHER)

## 2024-09-27 ENCOUNTER — Other Ambulatory Visit: Payer: Self-pay

## 2024-09-27 ENCOUNTER — Encounter (HOSPITAL_BASED_OUTPATIENT_CLINIC_OR_DEPARTMENT_OTHER): Payer: Self-pay

## 2024-09-27 DIAGNOSIS — Z87442 Personal history of urinary calculi: Secondary | ICD-10-CM | POA: Insufficient documentation

## 2024-09-27 DIAGNOSIS — Z79899 Other long term (current) drug therapy: Secondary | ICD-10-CM | POA: Insufficient documentation

## 2024-09-27 DIAGNOSIS — I10 Essential (primary) hypertension: Secondary | ICD-10-CM | POA: Insufficient documentation

## 2024-09-27 DIAGNOSIS — R0789 Other chest pain: Secondary | ICD-10-CM

## 2024-09-27 DIAGNOSIS — R519 Headache, unspecified: Secondary | ICD-10-CM | POA: Diagnosis present

## 2024-09-27 LAB — CBC
HCT: 50.7 % (ref 39.0–52.0)
Hemoglobin: 17.6 g/dL — ABNORMAL HIGH (ref 13.0–17.0)
MCH: 30.3 pg (ref 26.0–34.0)
MCHC: 34.7 g/dL (ref 30.0–36.0)
MCV: 87.3 fL (ref 80.0–100.0)
Platelets: 186 K/uL (ref 150–400)
RBC: 5.81 MIL/uL (ref 4.22–5.81)
RDW: 13.4 % (ref 11.5–15.5)
WBC: 7.7 K/uL (ref 4.0–10.5)
nRBC: 0 % (ref 0.0–0.2)

## 2024-09-27 LAB — TROPONIN T, HIGH SENSITIVITY
Troponin T High Sensitivity: <15 ng/L (ref 0–19)
Troponin T High Sensitivity: <15 ng/L (ref 0–19)

## 2024-09-27 LAB — BASIC METABOLIC PANEL WITH GFR
Anion gap: 11 (ref 5–15)
BUN: 13 mg/dL (ref 6–20)
CO2: 28 mmol/L (ref 22–32)
Calcium: 10.1 mg/dL (ref 8.9–10.3)
Chloride: 103 mmol/L (ref 98–111)
Creatinine, Ser: 1.27 mg/dL — ABNORMAL HIGH (ref 0.61–1.24)
GFR, Estimated: >60 mL/min
Glucose, Bld: 126 mg/dL — ABNORMAL HIGH (ref 70–99)
Potassium: 3.7 mmol/L (ref 3.5–5.1)
Sodium: 141 mmol/L (ref 135–145)

## 2024-09-27 MED ORDER — IOHEXOL 350 MG/ML SOLN
100.0000 mL | Freq: Once | INTRAVENOUS | Status: AC | PRN
  Administered 2024-09-27: 100 mL via INTRAVENOUS

## 2024-09-27 MED ORDER — SODIUM CHLORIDE 0.9 % IV BOLUS
500.0000 mL | Freq: Once | INTRAVENOUS | Status: AC
  Administered 2024-09-27: 500 mL via INTRAVENOUS

## 2024-09-27 NOTE — ED Provider Notes (Signed)
 " [place] EMERGENCY DEPARTMENT AT [facility] Provider Note  CSN: [id number] Arrival date & time: 09/27/24 0053  Chief Complaint(s) Chest Pain  History provided by patient. HPI & MDM Jonathan Mcclain is a 59 y.o. male .   Chest Pain Pain location:  Substernal area Pain quality: pressure   Duration:  2 hours Timing:  Constant Progression:  Resolved Chronicity:  New Relieved by: burping. Worsened by:  Nothing Associated symptoms: headache (when chest pain started improving) and shortness of breath   Associated symptoms: no abdominal pain, no back pain, no cough, no diaphoresis, no fatigue, no fever, no lower extremity edema, no nausea and no vomiting      Medical Decision Making Amount and/or Complexity of Data Reviewed Labs: ordered. Decision-making details documented in ED Course. Radiology: ordered and independent interpretation performed. Decision-making details documented in ED Course. ECG/medicine tests: ordered and independent interpretation performed. Decision-making details documented in ED Course.  Risk Prescription drug management. Decision regarding hospitalization.    Chest pain differential diagnosis considered.  Workup below.  EKG without acute ischemic changes, dysrhythmias or blocks.  Not consistent with pericarditis. Initial troponin negative.  Heart score less than 3.  Delta troponin negative.  Clinically ruled out for ACS.  CBC without leukocytosis or anemia. No significant electrolyte derangements.  Mild renal insufficiency without overt AKI. Chest x-ray without evidence of pneumonia, pneumothorax, pulmonary edema or pleural effusions.  Given hypertension, dissection considered and CTA ordered.  This was negative for aortic dissection, pulmonary embolism.  It did reveal evidence of esophagitis, which may have triggered esophageal spasm.   Final Clinical Impression(s) / ED Diagnoses Final diagnoses:  Chest discomfort  Essential hypertension    The patient appears reasonably screened and/or stabilized for discharge and I doubt any other medical condition or other [facility] requiring further screening, evaluation, or treatment in the ED at this time. I have discussed the findings, Dx and Tx plan with the patient/family who expressed understanding and agree(s) with the plan. Discharge instructions discussed at length. The patient/family was given strict return precautions who verbalized understanding of the instructions. No further questions at time of discharge.  Disposition: Discharge  Condition: Good  ED Discharge Orders          Ordered    Ambulatory referral to Cardiology       Comments: If you have not heard from the Cardiology office within the next 72 hours please call [phone].   09/27/24 0354              Follow Up: Alvia Bring, DO [address] [facility] [address]  [address] [place] [address] [phone]  Call  to schedule an appointment for close follow up  Elicia Claw, MD [address] [place] [address] [phone]  Call  to schedule an appointment for close follow up     Past Medical History Past Medical History:  Diagnosis Date   Allergy    SEASONAL   Heart murmur    EARLY TEENS   History of kidney stones    Hypertension    Patient Active Problem List   Diagnosis Date Noted   Well adult exam 11/15/2021   Essential hypertension 09/27/2021   GERD (gastroesophageal reflux disease) 09/27/2021   Chondromalacia of patellofemoral joint, left 06/29/2021   Plantar fasciitis of left foot 01/13/2019   Home Medication(s) Prior to Admission medications  Medication Sig Start Date End Date Taking? Authorizing Provider  losartan  (COZAAR ) 50 MG tablet Take 1 tablet (50  mg total) by mouth daily. 05/20/24   Alvia Bring, DO                                                                                                                                    Allergies Patient  has no known allergies.  Review of Systems Review of Systems  Constitutional:  Negative for diaphoresis, fatigue and fever.  Respiratory:  Positive for shortness of breath. Negative for cough.   Cardiovascular:  Positive for chest pain.  Gastrointestinal:  Negative for abdominal pain, nausea and vomiting.  Musculoskeletal:  Negative for back pain.  Neurological:  Positive for headaches (when chest pain started improving).   As noted in HPI  Physical Exam Vital Signs  I have reviewed the triage vital signs BP (!) 177/115   Pulse 79   Temp 97.8 F (36.6 C) (Oral)   Resp 18   Ht 6' (1.829 m)   Wt 90.7 kg   SpO2 95%   BMI 27.12 kg/m   Physical Exam Vitals reviewed.  Constitutional:      General: He is not in acute distress.    Appearance: He is well-developed. He is not diaphoretic.  HENT:     Head: Normocephalic and atraumatic.     Nose: Nose normal.  Eyes:     General: No scleral icterus.       Right eye: No discharge.        Left eye: No discharge.     Conjunctiva/sclera: Conjunctivae normal.     Pupils: Pupils are equal, round, and reactive to light.  Cardiovascular:     Rate and Rhythm: Normal rate and regular rhythm.     Heart sounds: No murmur heard.    No friction rub. No gallop.  Pulmonary:     Effort: Pulmonary effort is normal. No respiratory distress.     Breath sounds: Normal breath sounds. No stridor. No rales.  Abdominal:     General: There is no distension.     Palpations: Abdomen is soft.     Tenderness: There is no abdominal tenderness.  Musculoskeletal:        General: No tenderness.     Cervical back: Normal range of motion and neck supple.  Skin:    General: Skin is warm and dry.     Findings: No erythema or rash.  Neurological:     Mental Status: He is alert and oriented to person, place, and time.     ED Results and Treatments Labs (all labs ordered are listed, but only abnormal results are displayed) Labs Reviewed  BASIC METABOLIC  PANEL WITH GFR - Abnormal; Notable for the following components:      Result Value   Glucose, Bld 126 (*)    Creatinine, Ser 1.27 (*)    All other components within normal limits  CBC - Abnormal; Notable for the following components:   Hemoglobin 17.6 (*)  All other components within normal limits  TROPONIN T, HIGH SENSITIVITY  TROPONIN T, HIGH SENSITIVITY                                                                                                                         EKG  EKG Interpretation Date/Time:  Monday September 27 2024 01:05:47 EST Ventricular Rate:  75 PR Interval:  165 QRS Duration:  125 QT Interval:  380 QTC Calculation: 425 R Axis:   29  Text Interpretation: Sinus rhythm Consider left atrial enlargement IVCD, consider atypical RBBB ST elevation, consider inferior injury No old tracing to compare Confirmed by Trine Likes [id number]) on 09/27/2024 1:52:59 AM       Radiology CT Angio Chest/Abd/Pel for Dissection W and/or Wo Contrast Result Date: 09/27/2024 EXAM: CTA CHEST, ABDOMEN AND PELVIS WITHOUT AND WITH CONTRAST 09/27/2024 02:27:46 AM TECHNIQUE: CTA of the chest was performed without and with the administration of 100 mL of iohexol  (OMNIPAQUE ) 350 MG/ML injection. CTA of the abdomen and pelvis was performed without and with the administration of 100 mL of iohexol  (OMNIPAQUE ) 350 MG/ML injection. Multiplanar reformatted images are provided for review. MIP images are provided for review. Automated exposure control, iterative reconstruction, and/or weight based adjustment of the mA/kV was utilized to reduce the radiation dose to as low as reasonably achievable. COMPARISON: Plain film from earlier in the same day. CLINICAL HISTORY: Chest pain, evaluate for acute aortic syndrome. FINDINGS: VASCULATURE: AORTA: Initial precontrast images show no hyperdense crescent to suggest acute aortic injury. Postcontrast images show no aneurysmal dilatation or dissection. PULMONARY  ARTERIES: The pulmonary artery shows a normal branching pattern bilaterally. No filling defect to suggest pulmonary embolism is noted. GREAT VESSELS OF AORTIC ARCH: No acute finding. No dissection. No arterial occlusion or significant stenosis. CELIAC TRUNK: No acute finding. No occlusion or significant stenosis. SUPERIOR MESENTERIC ARTERY: No acute finding. No occlusion or significant stenosis. INFERIOR MESENTERIC ARTERY: No acute finding. No occlusion or significant stenosis. RENAL ARTERIES: No acute finding. No occlusion or significant stenosis. ILIAC ARTERIES: No acute finding. No occlusion or significant stenosis. CHEST: MEDIASTINUM: No mediastinal lymphadenopathy. The heart and pericardium demonstrate no acute abnormality. No coronary calcifications are seen. No cardiac enlargement is seen. The thoracic inlet is within normal limits. No hilar adenopathy is noted. LUNGS AND PLEURA: The lungs are well aerated bilaterally. No focal infiltrate or effusion is seen. Minimal basilar atelectatic changes are seen. No sizable parenchymal nodule is noted. No pneumothorax. THORACIC BONES AND SOFT TISSUES: No acute bony abnormality is noted. Mild circumferential thickening of the distal esophagus is seen, likely related to reflux disease. ABDOMEN AND PELVIS: LIVER: The liver is unremarkable. GALLBLADDER AND BILE DUCTS: Gallbladder is unremarkable. No biliary ductal dilatation. SPLEEN: The spleen is unremarkable. PANCREAS: The pancreas is unremarkable. ADRENAL GLANDS: Bilateral adrenal glands demonstrate no acute abnormality. KIDNEYS, URETERS AND BLADDER: Scattered renal cysts are noted bilaterally. These are stable from the prior exam. To follow up is recommended Previously seen left  renal stone is again identified in the lower pole measuring 5 mm. No obstructive changes are seen. No hydronephrosis. No perinephric or periureteral stranding. The bladder is well distended. GI AND BOWEL: Stomach and duodenal sweep demonstrate  no acute abnormality. The small bowel is unremarkable. The appendix is air-filled and within normal limits. No obstructive or inflammatory changes of the colon are seen. There is no bowel obstruction. No abnormal bowel wall thickening or distension. REPRODUCTIVE: The prostate is within normal limits. PERITONEUM AND RETROPERITONEUM: No ascites or free air. LYMPH NODES: No lymphadenopathy is seen. ABDOMINAL BONES AND SOFT TISSUES: No acute bony abnormality is noted. No acute soft tissue abnormality. IMPRESSION: 1. No evidence of acute aortic syndrome. 2. No pulmonary embolism. 3. Mild circumferential distal esophageal wall thickening, most consistent with reflux esophagitis. 4.  Left renal stone without obstructive change. Electronically signed by: Oneil Devonshire MD 09/27/2024 02:36 AM EST RP Workstation: [id number]   DG Chest 2 View Result Date: 09/27/2024 EXAM: 2 VIEW(S) XRAY OF THE CHEST 09/27/2024 01:40:17 AM COMPARISON: None available. CLINICAL HISTORY: CP CP CP CP FINDINGS: LUNGS AND PLEURA: No focal pulmonary opacity. No pleural effusion. No pneumothorax. HEART AND MEDIASTINUM: No acute abnormality of the cardiac and mediastinal silhouettes. BONES AND SOFT TISSUES: No acute osseous abnormality. IMPRESSION: 1. No acute process. Electronically signed by: Franky Crease MD 09/27/2024 01:41 AM EST RP Workstation: [id number]    Medications Ordered in ED Medications  sodium chloride  0.9 % bolus 500 mL (500 mLs Intravenous New Bag/Given 09/27/24 0206)  iohexol  (OMNIPAQUE ) 350 MG/ML injection 100 mL (100 mLs Intravenous Contrast Given 09/27/24 0227)   Procedures Procedures  (including critical care time)   This chart was dictated using voice recognition software.  Despite best efforts to proofread,  errors can occur which can change the documentation meaning.   Trine Raynell Moder, MD 09/27/24 [phone]  "

## 2024-09-27 NOTE — ED Triage Notes (Signed)
 Chest pain started 2330 No radiation No N/V +SHOB at times Also c/o HA

## 2025-05-20 ENCOUNTER — Ambulatory Visit: Admitting: Family Medicine
